# Patient Record
Sex: Male | Born: 1975 | Race: White | Hispanic: No | Marital: Married | Smoking: Former smoker
Health system: Southern US, Community
[De-identification: ages and names within clinical notes are randomized; demographics above are authoritative.]

## PROBLEM LIST (undated history)

## (undated) DIAGNOSIS — R51 Headache: Secondary | ICD-10-CM

## (undated) DIAGNOSIS — I10 Essential (primary) hypertension: Secondary | ICD-10-CM

## (undated) DIAGNOSIS — E785 Hyperlipidemia, unspecified: Secondary | ICD-10-CM

## (undated) DIAGNOSIS — R519 Headache, unspecified: Secondary | ICD-10-CM

## (undated) DIAGNOSIS — G8929 Other chronic pain: Secondary | ICD-10-CM

## (undated) DIAGNOSIS — G43909 Migraine, unspecified, not intractable, without status migrainosus: Secondary | ICD-10-CM

## (undated) HISTORY — PX: BACK SURGERY: SHX140

## (undated) HISTORY — PX: WISDOM TOOTH EXTRACTION: SHX21

## (undated) HISTORY — DX: Headache: R51

## (undated) HISTORY — DX: Hyperlipidemia, unspecified: E78.5

## (undated) HISTORY — DX: Essential (primary) hypertension: I10

## (undated) HISTORY — DX: Other chronic pain: G89.29

## (undated) HISTORY — PX: KNEE ARTHROSCOPY: SHX127

## (undated) HISTORY — DX: Migraine, unspecified, not intractable, without status migrainosus: G43.909

## (undated) HISTORY — DX: Headache, unspecified: R51.9

---

## 1998-08-04 ENCOUNTER — Emergency Department (HOSPITAL_COMMUNITY): Admission: EM | Admit: 1998-08-04 | Discharge: 1998-08-04 | Payer: Self-pay | Admitting: Emergency Medicine

## 2000-01-16 ENCOUNTER — Emergency Department (HOSPITAL_COMMUNITY): Admission: EM | Admit: 2000-01-16 | Discharge: 2000-01-16 | Payer: Self-pay | Admitting: Emergency Medicine

## 2005-06-29 ENCOUNTER — Emergency Department: Payer: Self-pay | Admitting: Emergency Medicine

## 2013-08-27 DIAGNOSIS — M7700 Medial epicondylitis, unspecified elbow: Secondary | ICD-10-CM | POA: Insufficient documentation

## 2015-04-07 DIAGNOSIS — Z8249 Family history of ischemic heart disease and other diseases of the circulatory system: Secondary | ICD-10-CM | POA: Insufficient documentation

## 2015-05-11 DIAGNOSIS — K219 Gastro-esophageal reflux disease without esophagitis: Secondary | ICD-10-CM | POA: Insufficient documentation

## 2015-05-11 DIAGNOSIS — E669 Obesity, unspecified: Secondary | ICD-10-CM | POA: Insufficient documentation

## 2015-05-26 DIAGNOSIS — E559 Vitamin D deficiency, unspecified: Secondary | ICD-10-CM | POA: Insufficient documentation

## 2015-05-26 DIAGNOSIS — R7303 Prediabetes: Secondary | ICD-10-CM | POA: Insufficient documentation

## 2015-08-28 ENCOUNTER — Encounter: Payer: Self-pay | Admitting: Pulmonary Disease

## 2015-08-28 ENCOUNTER — Ambulatory Visit (INDEPENDENT_AMBULATORY_CARE_PROVIDER_SITE_OTHER): Payer: BLUE CROSS/BLUE SHIELD | Admitting: Pulmonary Disease

## 2015-08-28 VITALS — BP 148/106 | HR 84 | Ht 70.0 in | Wt 209.8 lb

## 2015-08-28 DIAGNOSIS — R0683 Snoring: Secondary | ICD-10-CM

## 2015-08-28 DIAGNOSIS — Z720 Tobacco use: Secondary | ICD-10-CM | POA: Diagnosis not present

## 2015-08-28 DIAGNOSIS — Z23 Encounter for immunization: Secondary | ICD-10-CM

## 2015-08-28 NOTE — Progress Notes (Signed)
Past Medical History He  has a past medical history of HTN (hypertension); Hyperlipidemia; and Chronic headaches.  Past Surgical History He  has past surgical history that includes Knee arthroscopy and Wisdom tooth extraction.  No current outpatient prescriptions on file prior to visit.   No current facility-administered medications on file prior to visit.    No Known Allergies  Family History His family history includes Cancer - Lung in his mother; Heart disease in his father and mother.  Social History He  reports that he has been smoking Cigarettes.  He has a 12 pack-year smoking history. He does not have any smokeless tobacco history on file. He reports that he drinks alcohol. He reports that he does not use illicit drugs.  Review of systems Review of Systems  Constitutional: Negative for fever and unexpected weight change.  HENT: Negative for congestion, dental problem, ear pain, nosebleeds, postnasal drip, rhinorrhea, sinus pressure, sneezing, sore throat and trouble swallowing.   Eyes: Negative for redness and itching.  Respiratory: Negative for cough, chest tightness, shortness of breath and wheezing.   Cardiovascular: Negative for palpitations and leg swelling.  Gastrointestinal: Negative for nausea and vomiting.  Genitourinary: Negative for dysuria.  Musculoskeletal: Negative for joint swelling.  Skin: Negative for rash.  Neurological: Positive for headaches.  Hematological: Does not bruise/bleed easily.  Psychiatric/Behavioral: Negative for dysphoric mood. The patient is not nervous/anxious.     Chief Complaint  Patient presents with  . Sleep Consult    Self referral: severe snoring. Epworth Score: 6    Tests:  Vital signs BP 148/106 mmHg  Pulse 84  Ht 5\' 10"  (1.778 m)  Wt 209 lb 12.8 oz (95.165 kg)  BMI 30.10 kg/m2  SpO2 94%  History of Present Illness Yolonda KidaVonnie H Mccown is a 40 y.o. male for evaluation of sleep problems.  His wife has been concerned  about his sleep and snoring.  His sleep has been getting worse.  He has trouble falling asleep, and staying asleep.  His wife has told him that he stops breathing while asleep.  He will wake up with a dry mouth, and has trouble sleeping on his back.  He doesn't dream much.    He goes to sleep at 11 pm.  He falls asleep after a while.  He wakes up several times, but is not sure what wakes him up.  He sometimes watches TV to help him fall back to sleep.  He gets out of bed at 6 am.  He feels tired in the morning.  He gets frequent headaches.  He has been using [place] for years, but this doesn't seem to make a difference.  He tried OTC sleep aides, but no benefit from these either.  He drinks coffee in the morning, and sodas several times during the day.  He takes zoloft for anxiety >> he has lots of stress from work.  He denies sleep walking, sleep talking, bruxism, or nightmares.  There is no history of restless legs.  He denies sleep hallucinations, sleep paralysis, or cataplexy.  The Epworth score is 6 out of 24.   Physical Exam:  General - No distress ENT - No sinus tenderness, no oral exudate, no LAN, no thyromegaly, TM clear, pupils equal/reactive, MP 4, scalloped tongue Cardiac - s1s2 regular, no murmur, pulses symmetric Chest - No wheeze/rales/dullness, good air entry, normal respiratory excursion Back - No focal tenderness Abd - Soft, non-tender, no organomegaly, + bowel sounds Ext - No edema Neuro - Normal strength,  cranial nerves intact Skin - No rashes Psych - Normal mood, and behavior  Discussion: He has snoring, sleep disruption, witnessed apnea, and daytime sleepiness.  He has hx of HTN and mood disorder.  I am concerned he could have sleep apnea.  We discussed how sleep apnea can affect various health problems, including risks for hypertension, cardiovascular disease, and diabetes.  We also discussed how sleep disruption can increase risks for accidents, such as while driving.   Weight loss as a means of improving sleep apnea was also reviewed.  Additional treatment options discussed were CPAP therapy, oral appliance, and surgical intervention.   Assessment/plan:  Snoring. Plan: - will arrange for in lab sleep study to further assess  Hypertension. Plan: - advised him to discuss with his PCP  Tobacco abuse. Plan: - discussed options to assist with smoking cessation    Patient Instructions  Will arrange for sleep study Will call to arrange for follow up after sleep study reviewed      Coralyn Helling, M.D. Pager [phone]

## 2015-08-28 NOTE — Progress Notes (Deleted)
   Subjective:    Patient ID: Yolonda KidaVonnie H Stoecker, male    DOB: 09-15-1975, 40 y.o.   MRN: [id number]  HPI    Review of Systems  Constitutional: Negative for fever and unexpected weight change.  HENT: Negative for congestion, dental problem, ear pain, nosebleeds, postnasal drip, rhinorrhea, sinus pressure, sneezing, sore throat and trouble swallowing.   Eyes: Negative for redness and itching.  Respiratory: Negative for cough, chest tightness, shortness of breath and wheezing.   Cardiovascular: Negative for palpitations and leg swelling.  Gastrointestinal: Negative for nausea and vomiting.  Genitourinary: Negative for dysuria.  Musculoskeletal: Negative for joint swelling.  Skin: Negative for rash.  Neurological: Positive for headaches.  Hematological: Does not bruise/bleed easily.  Psychiatric/Behavioral: Negative for dysphoric mood. The patient is not nervous/anxious.        Objective:   Physical Exam        Assessment & Plan:

## 2015-08-28 NOTE — Patient Instructions (Signed)
Will arrange for sleep study Will call to arrange for follow up after sleep study reviewed 

## 2015-09-09 ENCOUNTER — Ambulatory Visit (HOSPITAL_BASED_OUTPATIENT_CLINIC_OR_DEPARTMENT_OTHER): Payer: BLUE CROSS/BLUE SHIELD | Attending: Pulmonary Disease | Admitting: Radiology

## 2015-09-09 DIAGNOSIS — R0683 Snoring: Secondary | ICD-10-CM | POA: Insufficient documentation

## 2015-09-09 DIAGNOSIS — R5383 Other fatigue: Secondary | ICD-10-CM | POA: Insufficient documentation

## 2015-09-09 DIAGNOSIS — G4730 Sleep apnea, unspecified: Secondary | ICD-10-CM | POA: Insufficient documentation

## 2015-09-09 DIAGNOSIS — G4733 Obstructive sleep apnea (adult) (pediatric): Secondary | ICD-10-CM | POA: Insufficient documentation

## 2015-09-09 DIAGNOSIS — Z79899 Other long term (current) drug therapy: Secondary | ICD-10-CM | POA: Insufficient documentation

## 2015-09-09 DIAGNOSIS — R51 Headache: Secondary | ICD-10-CM | POA: Insufficient documentation

## 2015-09-10 ENCOUNTER — Telehealth: Payer: Self-pay | Admitting: Pulmonary Disease

## 2015-09-10 DIAGNOSIS — G4733 Obstructive sleep apnea (adult) (pediatric): Secondary | ICD-10-CM | POA: Diagnosis not present

## 2015-09-10 NOTE — Telephone Encounter (Signed)
PSG 09/09/15 >> AHI 14.4, SaO2 low 88%.  CPAP 13 cm H2O >> AHI 0.  Will have my nurse inform pt that sleep study shows mild sleep apnea.  Options are 1) CPAP now, 2) ROV first.  If He is agreeable to CPAP, then please send order for auto CPAP range 5 to 15 cm H2O with heated humidity and mask of choice.  Have download sent 1 month after starting CPAP and set up ROV 2 months after starting CPAP >> can be with me or Tammy Parrett.

## 2015-09-10 NOTE — Progress Notes (Signed)
Patient Name: Cory Hoffman, Cory Hoffman Study Date: 09/09/2015 Gender: Male D.O.B: 06/10/1976 Age (years): 39 Referring Provider: Vineet Sood MD, ABSM Height (inches): 70 Interpreting Physician: Vineet Sood MD, ABSM Weight (lbs): 210 RPSGT: Gregory, Kenyon BMI: 30 MRN: [id number] Neck Size: 16.50  CLINICAL INFORMATION Sleep Study Type: Split Night CPAP Indication for sleep study: Fatigue, Morning Headaches, Snoring, Witnessed Apneas Epworth Sleepiness Score: 4  SLEEP STUDY TECHNIQUE As per the AASM Manual for the Scoring of Sleep and Associated Events v2.3 (April 2016) with a hypopnea requiring 4% desaturations. The channels recorded and monitored were frontal, central and occipital EEG, electrooculogram (EOG), submentalis EMG (chin), nasal and oral airflow, thoracic and abdominal wall motion, anterior tibialis EMG, snore microphone, electrocardiogram, and pulse oximetry. Continuous positive airway pressure (CPAP) was initiated when the patient met split night criteria and was titrated according to treat sleep-disordered breathing.  MEDICATIONS Medications taken by the patient : AMLODIPINE, LIPITOR, OMEPRAZOLE, SERTRALINE, AMBIEN  Medications administered by patient during sleep study : Sleep medicine administered - Ambien 10 mg at 10:00:26 PM  RESPIRATORY PARAMETERS Diagnostic Total AHI (/hr): 14.4 RDI (/hr): 31.3 OA Index (/hr): 0.5 CA Index (/hr): 0.0 REM AHI (/hr): N/A NREM AHI (/hr): 14.4 Supine AHI (/hr): 31.8 Non-supine AHI (/hr): 7.62 Min O2 Sat (%): 88.00 Mean O2 (%): 91.96 Time below 88% (min): 0.3   Titration Optimal Pressure (cm): 13 AHI at Optimal Pressure (/hr): 4.4 Min O2 at Optimal Pressure (%): 92.0 Supine % at Optimal (%): 0 Sleep % at Optimal (%): 98    SLEEP ARCHITECTURE The recording time for the entire night was 433.0 minutes. During a baseline period of 180.0 minutes, the patient slept for 120.6 minutes in REM and nonREM, yielding a sleep efficiency of 67.0%. Sleep  onset after lights out was 19.8 minutes with a REM latency of N/A minutes. The patient spent 14.61% of the night in stage N1 sleep, 75.03% in stage N2 sleep, 10.36% in stage N3 and 0.00% in REM. During the titration period of 245.5 minutes, the patient slept for 216.0 minutes in REM and nonREM, yielding a sleep efficiency of 88.0%. Sleep onset after CPAP initiation was 4.4 minutes with a REM latency of 7.5 minutes. The patient spent 16.44% of the night in stage N1 sleep, 48.84% in stage N2 sleep, 0.00% in stage N3 and 34.72% in REM.  CARDIAC DATA The 2 lead EKG demonstrated sinus rhythm. The mean heart rate was 74.23 beats per minute. Other EKG findings include: None.  LEG MOVEMENT DATA The total Periodic Limb Movements of Sleep (PLMS) were 53. The PLMS index was 9.41 .  IMPRESSIONS This study showed mild obstructive sleep apnea with an AHI of 14.4 and SaO2 low of 88%.  He did well with CPAP 13 cm H2O.  He was observed REM sleep at this pressure setting.  DIAGNOSIS - Obstructive Sleep Apnea (327.23 [G47.33 ICD-10])  RECOMMENDATIONS Additional therapies include weight loss, CPAP, oral appliance, or surgical assessment.  He was fitted with a Medium size [employer] Full Face Mask Simplus mask.   Vineet Sood, MD, ABSM Diplomate, American Board of Sleep Medicine 09/10/2015, 5:05 PM  NPI: [id number]  

## 2015-09-11 NOTE — Telephone Encounter (Signed)
lmtcb x1 for pt. 

## 2015-09-18 NOTE — Telephone Encounter (Signed)
lmomtcb x 2  

## 2015-09-23 NOTE — Telephone Encounter (Signed)
lmtcb x3 for pt. 

## 2015-09-24 ENCOUNTER — Ambulatory Visit (INDEPENDENT_AMBULATORY_CARE_PROVIDER_SITE_OTHER): Payer: BLUE CROSS/BLUE SHIELD | Admitting: Adult Health

## 2015-09-24 ENCOUNTER — Encounter: Payer: Self-pay | Admitting: Adult Health

## 2015-09-24 VITALS — BP 136/82 | HR 73 | Temp 98.0°F | Ht 70.0 in | Wt 208.0 lb

## 2015-09-24 DIAGNOSIS — G4733 Obstructive sleep apnea (adult) (pediatric): Secondary | ICD-10-CM | POA: Insufficient documentation

## 2015-09-24 NOTE — Patient Instructions (Signed)
Begin CPAP At bedtime   Goal is to wear for at least 4-6hr each night  Work on weight loss  Do not drive if sleepy CPAP download in 1 month .  Follow up Dr. Craige Cotta  Or Kirklin Mcduffee in 2 months

## 2015-09-24 NOTE — Progress Notes (Signed)
Subjective:    Patient ID: Cory Hoffman, male    DOB: 1976/06/24, 40 y.o.   MRN: [id number]  HPI 40 yo male seen for sleep consult 08/2015 .   TEST  PSG 09/09/15 >> AHI 14.4, SaO2 low 88%. CPAP 13 cm H2O >> AHI 0.  09/24/2015 Follow up : OSA  Pt returns for follow up for sleep apnea.  Pt was seen last month for sleep consult. For daytime sleepiness and snoring.  Sleep study showed mild  sleep apnea. , AHI 14.4 . See results above.  We reviewed his sleep study results.  Went over treatment options including, weight loss, CPAP and oral appliance.  He would like to proceed with CPAP .  Went over sleep apnea, CPAP patient education  Denies chest pain, orthopnea, edema or fever.    Past Medical History  Diagnosis Date  . HTN (hypertension)   . Hyperlipidemia   . Chronic headaches    Current Outpatient Prescriptions on File Prior to Visit  Medication Sig Dispense Refill  . Atorvastatin Calcium (LIPITOR PO) Take 40 mg by mouth daily.     Marland Kitchen OMEPRAZOLE PO Take 20 mg by mouth daily.     . SERTRALINE HCL PO Take 50 mg by mouth daily.     . SUMAtriptan (IMITREX) 50 MG tablet Take 50 mg by mouth every 2 (two) hours as needed for migraine. May repeat in 2 hours if headache persists or recurs.    Marland Kitchen zolpidem (AMBIEN) 10 MG tablet Take 10 mg by mouth at bedtime as needed for sleep.     No current facility-administered medications on file prior to visit.      Review of Systems Constitutional:   No  weight loss, night sweats,  Fevers, chills, + fatigue, or  lassitude.  HEENT:   No headaches,  Difficulty swallowing,  Tooth/dental problems, or  Sore throat,                No sneezing, itching, ear ache, nasal congestion, post nasal drip,   CV:  No chest pain,  Orthopnea, PND, swelling in lower extremities, anasarca, dizziness, palpitations, syncope.   GI  No heartburn, indigestion, abdominal pain, nausea, vomiting, diarrhea, change in bowel habits, loss of appetite, bloody stools.    Resp: No shortness of breath with exertion or at rest.  No excess mucus, no productive cough,  No non-productive cough,  No coughing up of blood.  No change in color of mucus.  No wheezing.  No chest wall deformity  Skin: no rash or lesions.  GU: no dysuria, change in color of urine, no urgency or frequency.  No flank pain, no hematuria   MS:  No joint pain or swelling.  No decreased range of motion.  No back pain.  Psych:  No change in mood or affect. No depression or anxiety.  No memory loss.         Objective:   Physical Exam  Filed Vitals:   09/24/15 1032  BP: 136/82  Pulse: 73  Temp: 98 F (36.7 C)  TempSrc: Oral  Height:  (1.778 m)  Weight: 208 lb (94.348 kg)  SpO2: 99%    Body mass index is 29.84 kg/(m^2).   GEN: A/Ox3; pleasant , NAD, well nourished   HEENT:  [place]/AT,  EACs-clear, TMs-wnl, NOSE-clear, THROAT-clear, no lesions, no postnasal drip or exudate noted. Class 2-3 MP airway   NECK:  Supple w/ fair ROM; no JVD; normal carotid impulses w/o bruits; no  thyromegaly or nodules palpated; no lymphadenopathy.  RESP  Clear  P & A; w/o, wheezes/ rales/ or rhonchi.no accessory muscle use, no dullness to percussion  CARD:  RRR, no m/r/g  , no peripheral edema, pulses intact, no cyanosis or clubbing.  GI:   Soft & nt; nml bowel sounds; no organomegaly or masses detected.  Musco: Warm bil, no deformities or joint swelling noted.   Neuro: alert, no focal deficits noted.    Skin: Warm, no lesions or rashes        Assessment & Plan:

## 2015-09-24 NOTE — Assessment & Plan Note (Signed)
Mild OSA on sleep study Pt education given , pt wishes to proceed with nocturnal CPAP   Plan  Begin CPAP At bedtime   Goal is to wear for at least 4-6hr each night  Work on weight loss  Do not drive if sleepy CPAP download in 1 month .  Follow up Dr. Craige Cotta  Or Parrett in 2 months

## 2015-09-24 NOTE — Progress Notes (Signed)
Reviewed and agree with assessment/plan. 

## 2015-09-26 NOTE — Telephone Encounter (Signed)
lmtcb

## 2015-09-29 NOTE — Telephone Encounter (Signed)
LM for patient Letter mailed to home address.  Will send to Dr Craige Cotta as Lorain Childes of several attempts to reach patient.

## 2015-09-29 NOTE — Telephone Encounter (Signed)
Noted  

## 2015-10-19 ENCOUNTER — Encounter (HOSPITAL_BASED_OUTPATIENT_CLINIC_OR_DEPARTMENT_OTHER): Payer: BLUE CROSS/BLUE SHIELD

## 2015-11-04 DIAGNOSIS — G4733 Obstructive sleep apnea (adult) (pediatric): Secondary | ICD-10-CM | POA: Diagnosis not present

## 2015-11-24 ENCOUNTER — Ambulatory Visit: Payer: BLUE CROSS/BLUE SHIELD | Admitting: Adult Health

## 2015-12-05 DIAGNOSIS — G4733 Obstructive sleep apnea (adult) (pediatric): Secondary | ICD-10-CM | POA: Diagnosis not present

## 2016-01-04 DIAGNOSIS — G4733 Obstructive sleep apnea (adult) (pediatric): Secondary | ICD-10-CM | POA: Diagnosis not present

## 2016-02-04 DIAGNOSIS — G4733 Obstructive sleep apnea (adult) (pediatric): Secondary | ICD-10-CM | POA: Diagnosis not present

## 2016-02-05 DIAGNOSIS — G43909 Migraine, unspecified, not intractable, without status migrainosus: Secondary | ICD-10-CM | POA: Diagnosis not present

## 2016-02-05 DIAGNOSIS — R5383 Other fatigue: Secondary | ICD-10-CM | POA: Diagnosis not present

## 2016-02-05 DIAGNOSIS — F419 Anxiety disorder, unspecified: Secondary | ICD-10-CM | POA: Diagnosis not present

## 2016-02-05 DIAGNOSIS — G4733 Obstructive sleep apnea (adult) (pediatric): Secondary | ICD-10-CM | POA: Diagnosis not present

## 2016-02-17 ENCOUNTER — Ambulatory Visit: Payer: BLUE CROSS/BLUE SHIELD | Admitting: Family

## 2016-02-17 DIAGNOSIS — Z0289 Encounter for other administrative examinations: Secondary | ICD-10-CM

## 2016-03-04 DIAGNOSIS — M25562 Pain in left knee: Secondary | ICD-10-CM | POA: Diagnosis not present

## 2016-03-05 DIAGNOSIS — G4733 Obstructive sleep apnea (adult) (pediatric): Secondary | ICD-10-CM | POA: Diagnosis not present

## 2016-03-08 ENCOUNTER — Ambulatory Visit: Payer: BLUE CROSS/BLUE SHIELD | Admitting: Family

## 2016-04-05 DIAGNOSIS — G4733 Obstructive sleep apnea (adult) (pediatric): Secondary | ICD-10-CM | POA: Diagnosis not present

## 2016-04-07 DIAGNOSIS — F419 Anxiety disorder, unspecified: Secondary | ICD-10-CM | POA: Diagnosis not present

## 2016-04-07 DIAGNOSIS — Z716 Tobacco abuse counseling: Secondary | ICD-10-CM | POA: Diagnosis not present

## 2016-04-07 DIAGNOSIS — Z720 Tobacco use: Secondary | ICD-10-CM | POA: Diagnosis not present

## 2016-04-07 DIAGNOSIS — Z0000 Encounter for general adult medical examination without abnormal findings: Secondary | ICD-10-CM | POA: Insufficient documentation

## 2016-04-07 DIAGNOSIS — G43909 Migraine, unspecified, not intractable, without status migrainosus: Secondary | ICD-10-CM | POA: Diagnosis not present

## 2016-05-06 DIAGNOSIS — G4733 Obstructive sleep apnea (adult) (pediatric): Secondary | ICD-10-CM | POA: Diagnosis not present

## 2016-05-11 DIAGNOSIS — G43909 Migraine, unspecified, not intractable, without status migrainosus: Secondary | ICD-10-CM | POA: Diagnosis not present

## 2016-05-11 DIAGNOSIS — K219 Gastro-esophageal reflux disease without esophagitis: Secondary | ICD-10-CM | POA: Diagnosis not present

## 2016-05-11 DIAGNOSIS — F419 Anxiety disorder, unspecified: Secondary | ICD-10-CM | POA: Diagnosis not present

## 2016-05-11 DIAGNOSIS — I10 Essential (primary) hypertension: Secondary | ICD-10-CM | POA: Diagnosis not present

## 2016-06-05 DIAGNOSIS — G4733 Obstructive sleep apnea (adult) (pediatric): Secondary | ICD-10-CM | POA: Diagnosis not present

## 2016-07-06 DIAGNOSIS — G4733 Obstructive sleep apnea (adult) (pediatric): Secondary | ICD-10-CM | POA: Diagnosis not present

## 2016-08-05 DIAGNOSIS — G4733 Obstructive sleep apnea (adult) (pediatric): Secondary | ICD-10-CM | POA: Diagnosis not present

## 2016-08-23 DIAGNOSIS — G4733 Obstructive sleep apnea (adult) (pediatric): Secondary | ICD-10-CM | POA: Diagnosis not present

## 2016-10-06 DIAGNOSIS — G43909 Migraine, unspecified, not intractable, without status migrainosus: Secondary | ICD-10-CM | POA: Diagnosis not present

## 2016-10-06 DIAGNOSIS — E785 Hyperlipidemia, unspecified: Secondary | ICD-10-CM | POA: Diagnosis not present

## 2016-10-06 DIAGNOSIS — K219 Gastro-esophageal reflux disease without esophagitis: Secondary | ICD-10-CM | POA: Diagnosis not present

## 2016-10-06 DIAGNOSIS — I10 Essential (primary) hypertension: Secondary | ICD-10-CM | POA: Diagnosis not present

## 2017-01-31 DIAGNOSIS — I10 Essential (primary) hypertension: Secondary | ICD-10-CM | POA: Diagnosis not present

## 2017-01-31 DIAGNOSIS — E785 Hyperlipidemia, unspecified: Secondary | ICD-10-CM | POA: Diagnosis not present

## 2017-01-31 DIAGNOSIS — G4700 Insomnia, unspecified: Secondary | ICD-10-CM | POA: Diagnosis not present

## 2017-01-31 DIAGNOSIS — K219 Gastro-esophageal reflux disease without esophagitis: Secondary | ICD-10-CM | POA: Diagnosis not present

## 2017-03-01 ENCOUNTER — Ambulatory Visit: Payer: BLUE CROSS/BLUE SHIELD | Admitting: Family

## 2017-03-14 ENCOUNTER — Other Ambulatory Visit (INDEPENDENT_AMBULATORY_CARE_PROVIDER_SITE_OTHER): Payer: BLUE CROSS/BLUE SHIELD

## 2017-03-14 ENCOUNTER — Ambulatory Visit (INDEPENDENT_AMBULATORY_CARE_PROVIDER_SITE_OTHER): Payer: BLUE CROSS/BLUE SHIELD | Admitting: Family

## 2017-03-14 ENCOUNTER — Other Ambulatory Visit: Payer: Self-pay | Admitting: Family

## 2017-03-14 ENCOUNTER — Telehealth: Payer: Self-pay | Admitting: Family

## 2017-03-14 ENCOUNTER — Encounter: Payer: Self-pay | Admitting: Family

## 2017-03-14 VITALS — BP 118/84 | HR 74 | Temp 98.2°F | Resp 16 | Ht 70.0 in | Wt 209.0 lb

## 2017-03-14 DIAGNOSIS — I10 Essential (primary) hypertension: Secondary | ICD-10-CM | POA: Diagnosis not present

## 2017-03-14 DIAGNOSIS — M25511 Pain in right shoulder: Secondary | ICD-10-CM

## 2017-03-14 DIAGNOSIS — K2100 Gastro-esophageal reflux disease with esophagitis, without bleeding: Secondary | ICD-10-CM | POA: Insufficient documentation

## 2017-03-14 LAB — BASIC METABOLIC PANEL
BUN: 9 mg/dL (ref 6–23)
CALCIUM: 9.2 mg/dL (ref 8.4–10.5)
CO2: 28 mEq/L (ref 19–32)
CREATININE: 0.98 mg/dL (ref 0.40–1.50)
Chloride: 104 mEq/L (ref 96–112)
GFR: 90.66 mL/min (ref >60.00)
GLUCOSE: 102 mg/dL — AB (ref 70–99)
Potassium: 3.8 mEq/L (ref 3.5–5.1)
Sodium: 139 mEq/L (ref 135–145)

## 2017-03-14 MED ORDER — ZOLPIDEM TARTRATE 10 MG PO TABS: 10.0000 mg | ORAL_TABLET | Freq: Every evening | ORAL | 1 refills | Status: DC | PRN

## 2017-03-14 MED ORDER — ENALAPRIL MALEATE 5 MG PO TABS: 5.0000 mg | ORAL_TABLET | Freq: Every day | ORAL | 1 refills | Status: DC

## 2017-03-14 MED ORDER — ATORVASTATIN CALCIUM 40 MG PO TABS: 40.0000 mg | ORAL_TABLET | Freq: Every day | ORAL | 0 refills | Status: DC

## 2017-03-14 MED ORDER — SERTRALINE HCL 100 MG PO TABS: 100.0000 mg | ORAL_TABLET | Freq: Every day | ORAL | 3 refills | Status: DC

## 2017-03-14 NOTE — Telephone Encounter (Signed)
Received order - needs to be changed to MRI of right shoulder with and without contrast in order to get scheduled.  Please correct and resend.

## 2017-03-14 NOTE — Assessment & Plan Note (Signed)
Acute pain in the right shoulder with concern for possible rotator cuff tendinopathy and labral tear. Obtain MRI. Treat conservatively with ice, home exercise therapy, and over-the-counter medications as needed for symptom relief and supportive care. Refer to orthopedics for further assessment and treatment. Follow-up pending MRI results and referral.

## 2017-03-14 NOTE — Telephone Encounter (Signed)
Done

## 2017-03-14 NOTE — Progress Notes (Signed)
Subjective:    Patient ID: Cory Hoffman, male    DOB: 16-Oct-1975, 41 y.o.   MRN: [id number]  Chief Complaint  Patient presents with  . Establish Care    hurt his right shoulder over a week ago, was working on race car and felt a pop in his shoulder     HPI:  Cory Hoffman is a 41 y.o. male who  has a past medical history of Chronic headaches; Frequent headaches; HTN (hypertension); Hyperlipidemia; and Migraines. and presents today for an office visit to establish care.    1.)  Shoulder - This is a new problem. Associated symptom of pain located in his right shoulder has been going on for about 1 week when he was working on a race car when he felt a pop located on the top. Describes a catching and popping sensation. Pain is sharp at times and is generally constant. No neck pain or numbness or tingling. Modifying factors include Tylenol which did not help very much. Function remains in tact, however now with pain. No previous history of shoulder injury. He is left handed.    2.) Insomnia - Currently maintained on Ambien. Reports taken medications prescribed and denies adverse side effects. Sleeping adequately with current medication regimen.  3.) Gastroesophageal reflux - currently maintained on omeprazole. Reports taking medication as prescribed and denies adverse side effects. Symptoms are generally well controlled with current medication regimen. Working on a GERD controlled diet.  4.) Hypertension - currently maintained on enalapril. Reports taking the medication as prescribed and denies adverse side effects or hypotensive readings. Denies worse headache of life no symptoms of end organ damage. Does not currently check blood pressure at home. Working on following a low-sodium diet.  BP Readings from Last 3 Encounters:  03/14/17 118/84  09/24/15 136/82  08/28/15 (!) 148/106     No Known Allergies    Outpatient Medications Prior to Visit  Medication Sig Dispense Refill  .  HYDROcodone-acetaminophen (NORCO/VICODIN) 5-325 MG tablet Take 1 tablet by mouth daily as needed.  0  . OMEPRAZOLE PO Take 20 mg by mouth daily.     . SUMAtriptan (IMITREX) 50 MG tablet Take 50 mg by mouth every 2 (two) hours as needed for migraine. May repeat in 2 hours if headache persists or recurs.    . Atorvastatin Calcium (LIPITOR PO) Take 40 mg by mouth daily.     . enalapril (VASOTEC) 5 MG tablet Take 1 tablet by mouth daily.  2  . SERTRALINE HCL PO Take 50 mg by mouth daily.     Marland Kitchen zolpidem (AMBIEN) 10 MG tablet Take 10 mg by mouth at bedtime as needed for sleep.     No facility-administered medications prior to visit.      Past Medical History:  Diagnosis Date  . Chronic headaches   . Frequent headaches   . HTN (hypertension)   . Hyperlipidemia   . Migraines       Past Surgical History:  Procedure Laterality Date  . BACK SURGERY    . KNEE ARTHROSCOPY    . WISDOM TOOTH EXTRACTION        Family History  Problem Relation Age of Onset  . Heart disease Mother   . Cancer - Lung Mother   . Heart disease Father   . Stroke Paternal Grandmother   . Heart attack Paternal Grandfather        Mid 36s      Social History   Social History  .  Marital status: Married    Spouse name: N/A  . Number of children: 4  . Years of education: 5616   Occupational History  . Data processing managerBranch manager    Social History Main Topics  . Smoking status: Current Every Day Smoker    Packs/day: 1.00    Years: 15.00    Types: Cigarettes  . Smokeless tobacco: Never Used  . Alcohol use 0.0 oz/week     Comment: social/weekly  . Drug use: No  . Sexual activity: Yes   Other Topics Concern  . Not on file   Social History Narrative   Fun/Hobby: Racing, Photographerdrag racing.       Review of Systems  Constitutional: Negative for chills and fever.  Eyes:       Negative for changes in vision  Respiratory: Negative for cough, chest tightness, wheezing and stridor.   Cardiovascular: Negative for  chest pain, palpitations and leg swelling.  Musculoskeletal:       Positive for right shoulder pain  Neurological: Negative for dizziness, weakness, light-headedness and numbness.  Psychiatric/Behavioral: Positive for sleep disturbance.       Objective:    BP 118/84 (BP Location: Left Arm, Patient Position: Sitting, Cuff Size: Large)   Pulse 74   Temp 98.2 F (36.8 C) (Oral)   Resp 16   Ht 5\' 10"  (1.778 m)   Wt 209 lb (94.8 kg)   SpO2 95%   BMI 29.99 kg/m  Nursing note and vital signs reviewed.  Physical Exam  Constitutional: He is oriented to person, place, and time. He appears well-developed and well-nourished. No distress.  Cardiovascular: Normal rate, regular rhythm, normal heart sounds and intact distal pulses.   Pulmonary/Chest: Effort normal and breath sounds normal.  Musculoskeletal:  Right shoulder - no obvious deformity, discoloration, or edema. Palpable tenderness over subacromial space. Range of motion slightly restricted greater than 120 of flexion and abduction with pain. Strength is decreased on the right compared to the left. Distal pulses and sensation are intact and appropriate. No neck pain. Clinic/pop noted with passive range of motion into flexion. Negative Neer's impingement; some weakness with empty can.  Neurological: He is alert and oriented to person, place, and time.  Skin: Skin is warm and dry.  Psychiatric: He has a normal mood and affect. His behavior is normal. Judgment and thought content normal.        Assessment & Plan:   Problem List Items Addressed This Visit      Cardiovascular and Mediastinum   Essential hypertension    Blood pressure well controlled with current medication regimen and no adverse side effects or hypotensive readings. Continue current dosage of enalapril. Encouraged to monitor blood pressure at home and follow low-sodium diet.      Relevant Medications   enalapril (VASOTEC) 5 MG tablet   atorvastatin (LIPITOR) 40 MG  tablet     Digestive   Gastroesophageal reflux disease with esophagitis    Symptoms of gastroesophageal reflux adequately maintained with current dosage of omeprazole and no adverse side effects. Discussed importance of a GERD-related diet. Continue current dosage of omeprazole.        Other   Acute pain of right shoulder - Primary    Acute pain in the right shoulder with concern for possible rotator cuff tendinopathy and labral tear. Obtain MRI. Treat conservatively with ice, home exercise therapy, and over-the-counter medications as needed for symptom relief and supportive care. Refer to orthopedics for further assessment and treatment. Follow-up pending MRI results  and referral.      Relevant Orders   Basic Metabolic Panel (BMET) (Completed)   AMB referral to orthopedics   MR SHOULDER RIGHT W WO CONTRAST       I have discontinued Mr. Puebla's SERTRALINE HCL PO. I have changed his Atorvastatin Calcium (LIPITOR PO) to atorvastatin (LIPITOR) 40 MG tablet. I have also changed his zolpidem and enalapril. Additionally, I am having him start on sertraline. Lastly, I am having him maintain his OMEPRAZOLE PO, SUMAtriptan, and HYDROcodone-acetaminophen.   Meds ordered this encounter  Medications  . sertraline (ZOLOFT) 100 MG tablet    Sig: Take 1 tablet (100 mg total) by mouth daily.    Dispense:  30 tablet    Refill:  3    Order Specific Question:   Supervising Provider    Answer:   Hillard Danker A [[id number]]  . zolpidem (AMBIEN) 10 MG tablet    Sig: Take 1 tablet (10 mg total) by mouth at bedtime as needed for sleep.    Dispense:  30 tablet    Refill:  1    Fill on or after 03/31/17    Order Specific Question:   Supervising Provider    Answer:   Hillard Danker A [[id number]]  . enalapril (VASOTEC) 5 MG tablet    Sig: Take 1 tablet (5 mg total) by mouth daily.    Dispense:  90 tablet    Refill:  1    Order Specific Question:   Supervising Provider    Answer:   Hillard Danker  A [[id number]]  . atorvastatin (LIPITOR) 40 MG tablet    Sig: Take 1 tablet (40 mg total) by mouth daily.    Dispense:  90 tablet    Refill:  0    Order Specific Question:   Supervising Provider    Answer:   Hillard Danker A [[id number]]     Follow-up: Return in about 1 month (around 04/14/2017), or if symptoms worsen or fail to improve.  Jeanine Luz, FNP

## 2017-03-14 NOTE — Patient Instructions (Signed)
Thank you for choosing [facility].  SUMMARY AND INSTRUCTIONS:  Please continue to take your medication as prescribed.  They will call to schedule an MRI for your shoulder.  Ice x 20 minutes every 2 hours and following activity.   Stretches and exercises daily.  They will call with your referral to orthopedics.   Medication:  Your prescription(s) have been submitted to your pharmacy or been printed and provided for you. Please take as directed and contact our office if you believe you are having problem(s) with the medication(s) or have any questions.  Labs:  Please stop by the lab on the lower level of the building for your blood work. Your results will be released to MyChart (or called to you) after review, usually within 72 hours after test completion. If any changes need to be made, you will be notified at that same time.  1.) The lab is open from 7:30am to 5:30 pm Monday-Friday 2.) No appointment is necessary 3.) Fasting (if needed) is 6-8 hours after food and drink; black coffee and water are okay   Follow up:  If your symptoms worsen or fail to improve, please contact our office for further instruction, or in case of emergency go directly to the emergency room at the closest medical facility.    [facility] Ask your health care provider which exercises are safe for you. Do exercises exactly as told by your health care provider and adjust them as directed. It is normal to feel mild stretching, pulling, tightness, or discomfort as you do these exercises, but you should stop right away if you feel sudden pain or your pain gets worse.Do not begin these exercises until told by your health care provider. Stretching and range of motion exercise This exercise warms up your muscles and joints and improves the movement and flexibility of your shoulder. This exercise also helps to relieve pain and stiffness. Exercise A: Passive shoulder horizontal adduction  1. Sit or  stand and pull your left / right elbow across your chest, toward your other shoulder. Stop when you feel a gentle stretch in the back of your shoulder and upper arm. ? Keep your arm at shoulder height. ? Keep your arm as close to your body as you comfortably can. 2. Hold for __________ seconds. 3. Slowly return to the starting position. Repeat __________ times. Complete this exercise __________ times a day. Strengthening exercises These exercises build strength and endurance in your shoulder. Endurance is the ability to use your muscles for a long time, even after they get tired. Exercise B: Scapular protraction, supine  1. Lie on your back on a firm surface. If directed, hold a __________ weight in your left / right hand. 2. Raise your left / right arm straight into the air so your hand is directly above your shoulder joint. 3. Lift your shoulder off of the floor so you push the weight into the air. Do not move your head, neck, or back. 4. Hold for __________ seconds. 5. Slowly return to the starting position. Let your muscles relax completely before you repeat this exercise. Repeat __________ times. Complete this exercise __________ times a day. Exercise C: Scapular retraction  1. Sit in a stable chair without armrests, or stand. 2. Secure an exercise band to a stable object in front of you so the band is at shoulder height. 3. Hold one end of the exercise band in each hand. 4. Squeeze your shoulder blades together and move your elbows slightly  behind you. Do not shrug your shoulders. 5. Hold for __________ seconds. 6. Slowly return to the starting position. Repeat __________ times. Complete this exercise __________ times a day. Exercise D: Shoulder external rotation 1. Lie down on your left / right side. 2. Bend your left / right elbow to an "L" shape (90 degrees). Place a small pillow or a rolled-up towel under your left / right upper arm. 3. With your elbow bent to 90 degrees, place  your left / right hand on your abdomen. 4. Squeeze your shoulder blade back toward your spine. 5. Keeping your upper arm against the pillow or towel, move (pivot) your forearm and hand away from your abdomen and toward the ceiling. Keep your elbow bent to 90 degrees. 6. Hold for __________ seconds. 7. Slowly return to the starting position. Repeat __________ times. Complete this exercise __________ times a day. Exercise E: Shoulder extension, prone  1. Lie on your abdomen on a firm surface so your left / right arm hangs over the edge. 2. Hold a __________ weight in your hand so your palm faces in toward your body. Your arm should be straight. 3. Squeeze your shoulder blade down toward the middle of your back. 4. Slowly raise your arm behind you, up to the height of the surface that you are lying on. Keep your arm straight. 5. Hold for __________ seconds. 6. Slowly return to the starting position and relax your muscles. Repeat __________ times. Complete this exercise __________ times a day. This information is not intended to replace advice given to you by your health care provider. Make sure you discuss any questions you have with your health care provider. Document Released: 08/09/2005 Document Revised: 04/15/2016 Document Reviewed: 07/12/2015 Elsevier Interactive Patient Education  [employer]2018 [employer]vier Inc.

## 2017-03-14 NOTE — Assessment & Plan Note (Signed)
Symptoms of gastroesophageal reflux adequately maintained with current dosage of omeprazole and no adverse side effects. Discussed importance of a GERD-related diet. Continue current dosage of omeprazole.

## 2017-03-14 NOTE — Assessment & Plan Note (Signed)
Blood pressure well controlled with current medication regimen and no adverse side effects or hypotensive readings. Continue current dosage of enalapril. Encouraged to monitor blood pressure at home and follow low-sodium diet.

## 2017-03-15 ENCOUNTER — Other Ambulatory Visit: Payer: Self-pay

## 2017-03-15 ENCOUNTER — Other Ambulatory Visit: Payer: Self-pay | Admitting: Family

## 2017-03-15 ENCOUNTER — Telehealth: Payer: Self-pay | Admitting: Family

## 2017-03-15 DIAGNOSIS — M25511 Pain in right shoulder: Secondary | ICD-10-CM

## 2017-03-15 MED ORDER — TRAMADOL HCL 50 MG PO TABS: 50.0000 mg | ORAL_TABLET | Freq: Three times a day (TID) | ORAL | 0 refills | Status: DC | PRN

## 2017-03-15 NOTE — Telephone Encounter (Signed)
Patient is requesting a pain rx for his shoulder. He states he is in a lot of pain. He has a MRI on Aug 3. You just saw this patient for the first time on 7/23.

## 2017-03-15 NOTE — Telephone Encounter (Signed)
Tramadol printed and to be faxed.

## 2017-03-15 NOTE — Progress Notes (Unsigned)
IMG 

## 2017-03-25 ENCOUNTER — Ambulatory Visit
Admission: RE | Admit: 2017-03-25 | Discharge: 2017-03-25 | Disposition: A | Discharge status: Home / Self Care | Payer: BLUE CROSS/BLUE SHIELD | Source: Ambulatory Visit | Attending: Family | Admitting: Family

## 2017-03-25 DIAGNOSIS — S46011A Strain of muscle(s) and tendon(s) of the rotator cuff of right shoulder, initial encounter: Secondary | ICD-10-CM | POA: Diagnosis not present

## 2017-03-25 DIAGNOSIS — M25511 Pain in right shoulder: Secondary | ICD-10-CM

## 2017-03-25 MED ORDER — IOPAMIDOL (ISOVUE-M 200) INJECTION 41%
15.0000 mL | Freq: Once | INTRAMUSCULAR | Status: AC
  Administered 2017-03-25: 15 mL via INTRA_ARTICULAR

## 2017-03-29 ENCOUNTER — Telehealth: Payer: Self-pay

## 2017-03-29 NOTE — Telephone Encounter (Signed)
Let pt know MRI results of shoulder. He stated that the tramadol is not working for him and is requesting something stronger. Not able to sleep good due to the pain. Please advise.

## 2017-03-30 NOTE — Telephone Encounter (Signed)
A short prescription for vicodin is available for him to pick up for breakthrough pain as needed. W

## 2017-03-31 ENCOUNTER — Other Ambulatory Visit: Payer: Self-pay | Admitting: Family

## 2017-03-31 MED ORDER — HYDROCODONE-ACETAMINOPHEN 5-325 MG PO TABS
1.0000 | ORAL_TABLET | Freq: Three times a day (TID) | ORAL | 0 refills | Status: DC | PRN
Start: 2017-03-31 — End: 2017-08-26

## 2017-03-31 NOTE — Telephone Encounter (Signed)
Pt aware that rx is ready for pick up at the front desk.

## 2017-04-21 DIAGNOSIS — M24111 Other articular cartilage disorders, right shoulder: Secondary | ICD-10-CM | POA: Diagnosis not present

## 2017-04-27 DIAGNOSIS — M75121 Complete rotator cuff tear or rupture of right shoulder, not specified as traumatic: Secondary | ICD-10-CM | POA: Diagnosis not present

## 2017-04-27 DIAGNOSIS — M7541 Impingement syndrome of right shoulder: Secondary | ICD-10-CM | POA: Diagnosis not present

## 2017-04-27 DIAGNOSIS — G8918 Other acute postprocedural pain: Secondary | ICD-10-CM | POA: Diagnosis not present

## 2017-04-27 DIAGNOSIS — M24111 Other articular cartilage disorders, right shoulder: Secondary | ICD-10-CM | POA: Diagnosis not present

## 2017-04-27 DIAGNOSIS — M7581 Other shoulder lesions, right shoulder: Secondary | ICD-10-CM | POA: Diagnosis not present

## 2017-04-28 ENCOUNTER — Telehealth: Payer: Self-pay | Admitting: Family

## 2017-04-28 ENCOUNTER — Telehealth: Payer: Self-pay

## 2017-04-28 MED ORDER — ZOLPIDEM TARTRATE 10 MG PO TABS
10.0000 mg | ORAL_TABLET | Freq: Every evening | ORAL | 1 refills | Status: DC | PRN
Start: 2017-04-28 — End: 2017-06-27

## 2017-04-28 NOTE — Telephone Encounter (Signed)
Ambien refilled and to be faxed.

## 2017-04-28 NOTE — Telephone Encounter (Signed)
Check [name] registry last filled 03/28/2017.../lmb  

## 2017-04-28 NOTE — Telephone Encounter (Signed)
Pt's wife called stating that the pt is needing a refill on zolpidem (AMBIEN) [facility] MG [facility] [address].

## 2017-04-28 NOTE — Telephone Encounter (Signed)
[facility].../lmb 

## 2017-04-28 NOTE — Telephone Encounter (Signed)
Error

## 2017-05-03 DIAGNOSIS — M24111 Other articular cartilage disorders, right shoulder: Secondary | ICD-10-CM | POA: Diagnosis not present

## 2017-05-05 DIAGNOSIS — M75111 Incomplete rotator cuff tear or rupture of right shoulder, not specified as traumatic: Secondary | ICD-10-CM | POA: Diagnosis not present

## 2017-05-05 DIAGNOSIS — M25511 Pain in right shoulder: Secondary | ICD-10-CM | POA: Diagnosis not present

## 2017-05-05 DIAGNOSIS — M25611 Stiffness of right shoulder, not elsewhere classified: Secondary | ICD-10-CM | POA: Diagnosis not present

## 2017-05-05 DIAGNOSIS — M6281 Muscle weakness (generalized): Secondary | ICD-10-CM | POA: Diagnosis not present

## 2017-05-10 DIAGNOSIS — M25611 Stiffness of right shoulder, not elsewhere classified: Secondary | ICD-10-CM | POA: Diagnosis not present

## 2017-05-10 DIAGNOSIS — M6281 Muscle weakness (generalized): Secondary | ICD-10-CM | POA: Diagnosis not present

## 2017-05-10 DIAGNOSIS — M75111 Incomplete rotator cuff tear or rupture of right shoulder, not specified as traumatic: Secondary | ICD-10-CM | POA: Diagnosis not present

## 2017-05-10 DIAGNOSIS — M25511 Pain in right shoulder: Secondary | ICD-10-CM | POA: Diagnosis not present

## 2017-05-16 DIAGNOSIS — M25511 Pain in right shoulder: Secondary | ICD-10-CM | POA: Diagnosis not present

## 2017-05-16 DIAGNOSIS — M6281 Muscle weakness (generalized): Secondary | ICD-10-CM | POA: Diagnosis not present

## 2017-05-16 DIAGNOSIS — M75111 Incomplete rotator cuff tear or rupture of right shoulder, not specified as traumatic: Secondary | ICD-10-CM | POA: Diagnosis not present

## 2017-05-16 DIAGNOSIS — M25611 Stiffness of right shoulder, not elsewhere classified: Secondary | ICD-10-CM | POA: Diagnosis not present

## 2017-05-24 DIAGNOSIS — M75111 Incomplete rotator cuff tear or rupture of right shoulder, not specified as traumatic: Secondary | ICD-10-CM | POA: Diagnosis not present

## 2017-05-24 DIAGNOSIS — M25611 Stiffness of right shoulder, not elsewhere classified: Secondary | ICD-10-CM | POA: Diagnosis not present

## 2017-05-24 DIAGNOSIS — M25511 Pain in right shoulder: Secondary | ICD-10-CM | POA: Diagnosis not present

## 2017-05-24 DIAGNOSIS — M6281 Muscle weakness (generalized): Secondary | ICD-10-CM | POA: Diagnosis not present

## 2017-05-26 DIAGNOSIS — M75111 Incomplete rotator cuff tear or rupture of right shoulder, not specified as traumatic: Secondary | ICD-10-CM | POA: Diagnosis not present

## 2017-05-26 DIAGNOSIS — M25611 Stiffness of right shoulder, not elsewhere classified: Secondary | ICD-10-CM | POA: Diagnosis not present

## 2017-05-26 DIAGNOSIS — M6281 Muscle weakness (generalized): Secondary | ICD-10-CM | POA: Diagnosis not present

## 2017-05-26 DIAGNOSIS — M25511 Pain in right shoulder: Secondary | ICD-10-CM | POA: Diagnosis not present

## 2017-05-31 DIAGNOSIS — M25511 Pain in right shoulder: Secondary | ICD-10-CM | POA: Diagnosis not present

## 2017-06-06 DIAGNOSIS — M25511 Pain in right shoulder: Secondary | ICD-10-CM | POA: Diagnosis not present

## 2017-06-24 ENCOUNTER — Telehealth: Payer: Self-pay | Admitting: Family

## 2017-06-24 NOTE — Telephone Encounter (Signed)
Pt wife requesting a refill of his zolpidem (AMBIEN) 10 MG tablet  Please advise  [facility]

## 2017-06-24 NOTE — Telephone Encounter (Signed)
Check [place] registry last filled 05/26/2017 pls advise...Raechel Chute/lmb

## 2017-06-27 MED ORDER — ZOLPIDEM TARTRATE 10 MG PO TABS: 10.0000 mg | ORAL_TABLET | Freq: Every evening | ORAL | 0 refills | Status: DC | PRN

## 2017-06-27 NOTE — Telephone Encounter (Signed)
Rx has been called in. Pts wife is aware.

## 2017-06-27 NOTE — Telephone Encounter (Signed)
Spouse has called back in regard.  States pharmacy has not received this medication.  Please call once sent at [phone].

## 2017-06-27 NOTE — Telephone Encounter (Signed)
I will refill this medication.

## 2017-07-26 ENCOUNTER — Other Ambulatory Visit: Payer: Self-pay | Admitting: Nurse Practitioner

## 2017-07-28 ENCOUNTER — Telehealth: Payer: Self-pay | Admitting: Family

## 2017-07-28 MED ORDER — ZOLPIDEM TARTRATE 10 MG PO TABS: 10.0000 mg | ORAL_TABLET | Freq: Every evening | ORAL | 1 refills | Status: DC | PRN

## 2017-07-28 NOTE — Telephone Encounter (Signed)
Pt contacted pharmacy and refill for Ambien still not received.

## 2017-07-28 NOTE — Telephone Encounter (Signed)
Pt has appt scheduled w/Ashley 08/31/17 pls advise if ok to refill since Tammy SoursGreg is no longer here...Raechel Chute/lmb

## 2017-07-28 NOTE — Telephone Encounter (Signed)
Copied from CRM [id number]. Topic: Quick Communication - Rx Refill/Question >> Jul 28, 2017 11:50 AM Gerrianne ScalePayne, Gabrial Poppell L wrote: Has the patient contacted their pharmacy? Yes.     (Agent: If no, request that the patient contact the pharmacy for the refill.)   zolpidem (AMBIEN) 10 MG tablet will be pt second night without this medicine  Preferred Pharm[facility] (with phone number [facility] street name):[facility] [address]   Agent: Please be advised that RX refills may take up to 3 business days. We ask that you follow-up with your pharmacy.

## 2017-07-28 NOTE — Telephone Encounter (Signed)
Refill sent in

## 2017-07-29 NOTE — Telephone Encounter (Signed)
Called pt no answer LMOM rx has been [facility].../lmb

## 2017-08-18 ENCOUNTER — Telehealth: Payer: Self-pay | Admitting: Nurse Practitioner

## 2017-08-18 MED ORDER — OMEPRAZOLE 20 MG PO CPDR: 20.0000 mg | DELAYED_RELEASE_CAPSULE | Freq: Every day | ORAL | 0 refills | Status: DC

## 2017-08-18 NOTE — Telephone Encounter (Signed)
Will route to [facility] [place] pool Copied from CRM [id number]. Topic: Quick Communication - See Telephone Encounter >> Aug 18, 2017 11:29 AM Cipriano BunkerLambe, Annette S wrote: CRM for notification. See Telephone encounter for:   Ambien and Omeprazole refills needed.  He is out of medication. He has appt. To see Alphonse Guildshleigh Shambley on 08/26/17  [id number] [place], [place] - [address] [place] [address] Phone: [phone] Fax: [phone]    08/18/17.

## 2017-08-18 NOTE — Telephone Encounter (Signed)
Copied from CRM [id number]. Topic: Quick Communication - See Telephone Encounter >> Aug 18, 2017 11:29 AM Cipriano BunkerLambe, Annette S wrote: CRM for notification. See Telephone encounter for:   Ambien and Omeprazole refills needed.  He is out of medication. He has appt. With [facility] 08[facility]26[facility]17  [id number] [place] - [address] [place] [address] Phone: [phone] Fax: [phone]    08/18/17.

## 2017-08-18 NOTE — Telephone Encounter (Signed)
Duplicate msg... Already been taking care of...Raechel Chute/lmb

## 2017-08-18 NOTE — Telephone Encounter (Signed)
Notified pt sent omeprazole. [place] script has 1 refill left need to contact pharmacy to have refill...Raechel Chute/lmb

## 2017-08-26 ENCOUNTER — Other Ambulatory Visit (INDEPENDENT_AMBULATORY_CARE_PROVIDER_SITE_OTHER): Payer: BLUE CROSS/BLUE SHIELD

## 2017-08-26 ENCOUNTER — Ambulatory Visit (INDEPENDENT_AMBULATORY_CARE_PROVIDER_SITE_OTHER): Payer: BLUE CROSS/BLUE SHIELD | Admitting: Nurse Practitioner

## 2017-08-26 ENCOUNTER — Encounter: Payer: Self-pay | Admitting: Nurse Practitioner

## 2017-08-26 ENCOUNTER — Ambulatory Visit: Payer: BLUE CROSS/BLUE SHIELD | Admitting: Nurse Practitioner

## 2017-08-26 ENCOUNTER — Other Ambulatory Visit: Payer: Self-pay | Admitting: Nurse Practitioner

## 2017-08-26 VITALS — BP 142/100 | HR 87 | Temp 98.3°F | Resp 16 | Ht 70.0 in | Wt 203.6 lb

## 2017-08-26 DIAGNOSIS — G4700 Insomnia, unspecified: Secondary | ICD-10-CM | POA: Diagnosis not present

## 2017-08-26 DIAGNOSIS — G43909 Migraine, unspecified, not intractable, without status migrainosus: Secondary | ICD-10-CM | POA: Diagnosis not present

## 2017-08-26 DIAGNOSIS — K2100 Gastro-esophageal reflux disease with esophagitis, without bleeding: Secondary | ICD-10-CM

## 2017-08-26 DIAGNOSIS — F419 Anxiety disorder, unspecified: Secondary | ICD-10-CM | POA: Diagnosis not present

## 2017-08-26 DIAGNOSIS — E7800 Pure hypercholesterolemia, unspecified: Secondary | ICD-10-CM

## 2017-08-26 DIAGNOSIS — I10 Essential (primary) hypertension: Secondary | ICD-10-CM

## 2017-08-26 LAB — COMPREHENSIVE METABOLIC PANEL
ALBUMIN: 4.6 g/dL (ref 3.5–5.2)
ALK PHOS: 77 U/L (ref 39–117)
ALT: 22 U/L (ref 0–53)
AST: 16 U/L (ref 0–37)
BUN: 10 mg/dL (ref 6–23)
CALCIUM: 9.5 mg/dL (ref 8.4–10.5)
CO2: 28 mEq/L (ref 19–32)
Chloride: 104 mEq/L (ref 96–112)
Creatinine, Ser: 1.09 mg/dL (ref 0.40–1.50)
GFR: 79.99 mL/min (ref >60.00)
Glucose, Bld: 101 mg/dL — ABNORMAL HIGH (ref 70–99)
POTASSIUM: 4.5 meq/L (ref 3.5–5.1)
Sodium: 141 mEq/L (ref 135–145)
TOTAL PROTEIN: 7.3 g/dL (ref 6.0–8.3)
Total Bilirubin: 0.4 mg/dL (ref 0.2–1.2)

## 2017-08-26 LAB — CBC
HCT: 50.4 % (ref 39.0–52.0)
HEMOGLOBIN: 16.8 g/dL (ref 13.0–17.0)
MCHC: 33.4 g/dL (ref 30.0–36.0)
MCV: 88.2 fl (ref 78.0–100.0)
PLATELETS: 298 10*3/uL (ref 150.0–400.0)
RBC: 5.71 Mil/uL (ref 4.22–5.81)
RDW: 13.1 % (ref 11.5–15.5)
WBC: 9.7 10*3/uL (ref 4.0–10.5)

## 2017-08-26 LAB — LIPID PANEL
Cholesterol: 180 mg/dL (ref 0–200)
HDL: 34.4 mg/dL — AB (ref >39.00)
NONHDL: 145.12
TRIGLYCERIDES: 286 mg/dL — AB (ref 0.0–149.0)
Total CHOL/HDL Ratio: 5
VLDL: 57.2 mg/dL — ABNORMAL HIGH (ref 0.0–40.0)

## 2017-08-26 LAB — LDL CHOLESTEROL, DIRECT: Direct LDL: 111 mg/dL

## 2017-08-26 MED ORDER — OMEPRAZOLE 20 MG PO CPDR: 20.0000 mg | DELAYED_RELEASE_CAPSULE | Freq: Every day | ORAL | 0 refills | Status: DC

## 2017-08-26 MED ORDER — ZOLPIDEM TARTRATE 10 MG PO TABS: 10.0000 mg | ORAL_TABLET | Freq: Every evening | ORAL | 0 refills | Status: DC | PRN

## 2017-08-26 MED ORDER — PROPRANOLOL HCL ER 80 MG PO CP24: 80.0000 mg | ORAL_CAPSULE | Freq: Every day | ORAL | 1 refills | Status: DC

## 2017-08-26 MED ORDER — SERTRALINE HCL 100 MG PO TABS: 100.0000 mg | ORAL_TABLET | Freq: Every day | ORAL | 0 refills | Status: DC

## 2017-08-26 MED ORDER — ATORVASTATIN CALCIUM 40 MG PO TABS: 40.0000 mg | ORAL_TABLET | Freq: Every day | ORAL | 0 refills | Status: DC

## 2017-08-26 MED ORDER — ENALAPRIL MALEATE 5 MG PO TABS: 5.0000 mg | ORAL_TABLET | Freq: Every day | ORAL | 0 refills | Status: DC

## 2017-08-26 MED ORDER — PROPRANOLOL HCL ER 80 MG PO CP24: 80.0000 mg | ORAL_CAPSULE | Freq: Every day | ORAL | 0 refills | Status: DC

## 2017-08-26 NOTE — Assessment & Plan Note (Signed)
Long history of migraines which have not worsened. He does have about 2 per week We discussed starting a daily preventive medication due to frequency of migraines, and he is agreeable. Medications ordered: - propranolol ER (INDERAL LA) 80 MG 24 hr capsule; Take 1 capsule (80 mg total) by mouth daily.  Dispense: 90 capsule; Refill: 0 He will keep a headache log and RTC in 1 month for follow up of migraines.

## 2017-08-26 NOTE — Addendum Note (Signed)
Addended by: Evaristo BurySHAMBLEY, ASHLEIGH N on: 08/26/2017 12:44 PM   Modules accepted: Level of Service

## 2017-08-26 NOTE — Progress Notes (Addendum)
Subjective:    Patient ID: Cory Hoffman, male    DOB: 05-10-1976, 42 y.o.   MRN: [id number] mir HPI  Mr Cory Hoffman is a 42 yo male who presents today to establish care. He is transferring to me from another provider in the same clinic. He Has the following significant medical problems: HTN, OSA, GERD. He is here today with no complaints requesting refills of his daily medications.  Hypertension -maintained on enalapril 5 daily. Reports he has not been taking his medication for about 1.5 weeks, he had no refill. Reports daily medication compliance without adverse medication effects. Denies headaches, vision changes, chest pain, shortness of breath, edema.  BP Readings from Last 3 Encounters:  08/26/17 (!) 142/100  03/14/17 118/84  09/24/15 136/82    Cholesterol- maintained on atorvastatin 40. Reports daily medication compliance without adverse medication effects including myalgias. Diet- heavy fast food due to frequent travel; does cook at home at night. Does eat veggies with dinner. Exercise- walks about 4 miles a day.  GERD- maintained on prilosec. He reports taking the medication daily without adverse effects. If he misses a dose he experiences burning in his chest and throat, especially if lying flat.  Anxiety-maintained on zoloft 100 daily. He reports taking the medication daily without adverse reactions including dizziness or drowsiness.  He denies thoughts of harming himself or others. He feels his anxiety is well controlled on the zoloft.  Migraines- This is a chronic problem. The migraines began about 15 years ago. The migraines have not gotten worse over time. He has been maintained on imitrex but says he has to take it about twice a week. He describes his migraines as pressure behind both eyes. He feels nauseated and sometimes vomits when he gets the headaches. The headaches last for hours to days, and occasionally he has to leave Bright lights make the headaches  worse. Denies syncope, weakness, confusion. Has was on a daily preventive medication years ago but did not feel it helped, he can not recall the name.  Insomnia- maintained on ambien.10 at bedtime He takes the [place] every night. He is able to sleep about 6 hours each night  He is not able to sleep without the [place]ien, he went days without sleep before he started the [place]ien Denies hallucinations, disorientation, lethargy, impaired memory.  Review of Systems  See HPI  Past Medical History:  Diagnosis Date  . Chronic headaches   . Frequent headaches   . HTN (hypertension)   . Hyperlipidemia   . Migraines      Social History   Socioeconomic History  . Marital status: Married    Spouse name: Not on file  . Number of children: 4  . Years of education: 5916  . Highest education level: Not on file  Social Needs  . Financial resource strain: Not on file  . Food insecurity - worry: Not on file  . Food insecurity - inability: Not on file  . Transportation needs - medical: Not on file  . Transportation needs - non-medical: Not on file  Occupational History  . Occupation: Data processing managerBranch manager  Tobacco Use  . Smoking status: Current Every Day Smoker    Packs/day: 1.00    Years: 15.00    Pack years: 15.00    Types: Cigarettes  . Smokeless tobacco: Never Used  Substance and Sexual Activity  . Alcohol use: Yes    Alcohol/week: 0.0 oz    Comment: social/weekly  . Drug use: No  . Sexual activity:  Yes  Other Topics Concern  . Not on file  Social History Narrative   Fun/Hobby: Racing, Photographer.     Past Surgical History:  Procedure Laterality Date  . BACK SURGERY    . KNEE ARTHROSCOPY    . WISDOM TOOTH EXTRACTION      Family History  Problem Relation Age of Onset  . Heart disease Mother   . Cancer - Lung Mother   . Heart disease Father   . Stroke Paternal Grandmother   . Heart attack Paternal Grandfather        Mid 28s    No Known Allergies  Current Outpatient  Medications on File Prior to Visit  Medication Sig Dispense Refill  . atorvastatin (LIPITOR) 40 MG tablet Take 1 tablet (40 mg total) by mouth daily. 90 tablet 0  . enalapril (VASOTEC) 5 MG tablet Take 1 tablet (5 mg total) by mouth daily. 90 tablet 1  . HYDROcodone-acetaminophen (NORCO) 5-325 MG tablet Take 1 tablet by mouth every 8 (eight) hours as needed for moderate pain or severe pain. 15 tablet 0  . omeprazole (PRILOSEC) 20 MG capsule Take 1 capsule (20 mg total) by mouth daily. Must keep scheduled appt w/new provider for refills 30 capsule 0  . sertraline (ZOLOFT) 100 MG tablet Take 1 tablet (100 mg total) by mouth daily. 30 tablet 3  . SUMAtriptan (IMITREX) 50 MG tablet Take 50 mg by mouth every 2 (two) hours as needed for migraine. May repeat in 2 hours if headache persists or recurs.    . traMADol (ULTRAM) 50 MG tablet Take 1 tablet (50 mg total) by mouth every 8 (eight) hours as needed. 60 tablet 0  . zolpidem (AMBIEN) 10 MG tablet Take 1 tablet (10 mg total) by mouth at bedtime as needed for sleep. Needs to keep visit for any more refills 30 tablet 1   No current facility-administered medications on file prior to visit.     BP (!) 142/100 (BP Location: Left Arm, Patient Position: Sitting, Cuff Size: Large)   Pulse 87   Temp 98.3 F (36.8 C) (Oral)   Resp 16   Ht 5\' 10"  (1.778 m)   Wt 203 lb 9.6 oz (92.4 kg)   SpO2 97%   BMI 29.21 kg/m        Objective:   Physical Exam  Constitutional: He is oriented to person, place, and time. He appears well-developed and well-nourished. No distress.  HENT:  Head: Normocephalic and atraumatic.  Eyes: EOM are normal. Pupils are equal, round, and reactive to light.  Neck: Normal range of motion. Neck supple.  Cardiovascular: Normal rate, regular rhythm, normal heart sounds and intact distal pulses.  Pulmonary/Chest: Effort normal and breath sounds normal.  Musculoskeletal: He exhibits no edema.  Neurological: He is alert and oriented  to person, place, and time. He has normal strength and normal reflexes. No cranial nerve deficit. Coordination and gait normal.  Skin: Skin is warm and dry.  Psychiatric: He has a normal mood and affect. Judgment and thought content normal.      Assessment & Plan:   A total of 40  minutes were spent face-to-face with the patient during this encounter and over half of that time was spent on counseling and coordination of care. The patient was counseled on medications including controlled substances and chronic conditions.

## 2017-08-26 NOTE — Assessment & Plan Note (Signed)
-   sertraline (ZOLOFT) 100 MG tablet; Take 1 tablet (100 mg total) by mouth daily.  Dispense: 90 tablet; Refill: 0

## 2017-08-26 NOTE — Progress Notes (Signed)
Med rec

## 2017-08-26 NOTE — Assessment & Plan Note (Signed)
Medications ordered: - zolpidem (AMBIEN) 10 MG tablet; Take 1 tablet (10 mg total) by mouth at bedtime as needed for sleep.  Dispense: 30 tablet; Refill:  Controlled substance registry reviewed with no irregularities. We may need to consider [place]ien as a contributor to his headaches, anxiety, and elevated blood pressure in the future if these conditions remain uncontrolled, although it does appear that hes had headaches and elevated blood pressure for some time, likely before starting the [place]

## 2017-08-26 NOTE — Assessment & Plan Note (Signed)
Has been maintained on atorvastatin for some time with no complaints of adverse reactions. No lipid panel on file. Diagnostic testing ordered: - Lipid panel; Future Medications ordered: - atorvastatin (LIPITOR) 40 MG tablet; Take 1 tablet (40 mg total) by mouth daily.  Dispense: 90 tablet; Refill: 0

## 2017-08-26 NOTE — Assessment & Plan Note (Signed)
BP elevated today but he has been off his medication for over 1 week due to no refills. Normal readings in the past when taking his medication as prescribed. Discussed importance of taking medication daily along with healthy diet and exercise. Diagnostic testing ordered: - CBC; Future - Comprehensive metabolic panel; Future Medications ordered: - enalapril (VASOTEC) 5 MG tablet; Take 1 tablet (5 mg total) by mouth daily.  Dispense: 90 tablet; Refill: 0 RTC in 1 month for re-check

## 2017-08-26 NOTE — Assessment & Plan Note (Signed)
-   omeprazole (PRILOSEC) 20 MG capsule; Take 1 capsule (20 mg total) by mouth daily. Must keep scheduled appt w/new provider for refills  Dispense: 90 capsule; Refill: 0

## 2017-08-26 NOTE — Patient Instructions (Addendum)
Please resume your enalapril 5 daily for your blood pressure.  Please start propanolol (Inderal LA) 80 mg once daily for your migraines. Please keep a headache log of the days you have headaches.  Id like to see you back in 1 month to see how your migraines are doing and see if your blood pressure has improved.  Please work on your diet and exercise as we discussed. Remember half of your plate should be veggies, one-fourth carbs, one-fourth meat, and don't eat meat at every meal. Also, remember to stay away from sugary drinks. I'd like for you to start incorporating exercise into your daily schedule. Start at 10 minutes a day, working up to 30 minutes five times a week.   It was nice to meet you. Thanks for letting me take care of you today :)

## 2017-09-19 ENCOUNTER — Ambulatory Visit (INDEPENDENT_AMBULATORY_CARE_PROVIDER_SITE_OTHER): Payer: BLUE CROSS/BLUE SHIELD | Admitting: Orthopaedic Surgery

## 2017-09-19 ENCOUNTER — Encounter (INDEPENDENT_AMBULATORY_CARE_PROVIDER_SITE_OTHER): Payer: Self-pay | Admitting: Orthopaedic Surgery

## 2017-09-19 DIAGNOSIS — M23204 Derangement of unspecified medial meniscus due to old tear or injury, left knee: Secondary | ICD-10-CM | POA: Diagnosis not present

## 2017-09-19 DIAGNOSIS — G8929 Other chronic pain: Secondary | ICD-10-CM | POA: Diagnosis not present

## 2017-09-19 DIAGNOSIS — M25562 Pain in left knee: Secondary | ICD-10-CM | POA: Diagnosis not present

## 2017-09-19 DIAGNOSIS — M1712 Unilateral primary osteoarthritis, left knee: Secondary | ICD-10-CM | POA: Insufficient documentation

## 2017-09-19 MED ORDER — MELOXICAM 7.5 MG PO TABS: 7.5000 mg | ORAL_TABLET | Freq: Every day | ORAL | 2 refills | Status: DC | PRN

## 2017-09-19 MED ORDER — TRAMADOL HCL 50 MG PO TABS: 50.0000 mg | ORAL_TABLET | Freq: Four times a day (QID) | ORAL | 0 refills | Status: DC | PRN

## 2017-09-19 NOTE — Progress Notes (Signed)
Office Visit Note   Patient: Cory Hoffman           Date of Birth: February 10, 1976           MRN: [id number] Visit Date: 09/19/2017              Requested by: Evaristo BuryShambley, Ashleigh N, NP [address] [place], [place] [id number] PCP: Evaristo BuryShambley, Ashleigh N, NP   Assessment & Plan: Visit Diagnoses:  1. Degenerative tear of medial meniscus of left knee   2. Chronic pain of left knee     Plan: At this point, [facility] as well as an MRI of his left knee to assess his medial meniscus.  I have given him a prescription for Mobic as well as tramadol to use in the meantime.  He will follow-up with us once his MRI is completed.  Follow-Up Instructions: Return for following MRI.   Orders:  Orders Placed This Encounter  Procedures  . MR Knee Left w/o contrast  . DG Knee 3 Views Left   Meds ordered this encounter  Medications  . traMADol (ULTRAM) 50 MG tablet    Sig: Take 1 tablet (50 mg total) by mouth every 6 (six) hours as needed.    Dispense:  30 tablet    Refill:  0    Order Specific Question:   Supervising Provider    Answer:   Tarry KosXU, NAIPING M [[id number]]  . meloxicam (MOBIC) 7.5 MG tablet    Sig: Take 1 tablet (7.5 mg total) by mouth daily as needed for pain.    Dispense:  30 tablet    Refill:  2    Order Specific Question:   Supervising Provider    Answer:   Tarry KosXU, NAIPING M [[id number]]      Procedures: No procedures performed   Clinical Data: No additional findings.   Subjective: Chief Complaint  Patient presents with  . Left Knee - Pain    HPI Cory Hoffman is a pleasant 42 year old gentleman who presents to our clinic today with left knee pain.  He is 10 years out from a left knee arthroscopic meniscectomy by a physician in [place].  He says that he has had pain ever since.  It is progressively worsened.  All of his pain is medial aspect.  He describes this as a constant ache with hot poker type pains when he is  climbing ladders pivoting or flexing the knee.  He also describes a fair amount of walking and catching.  No instability.  He has tried over-the-counter medications with no relief of symptoms.  He does note that he had a cortisone injection approximately 2 years ago which was of minimal relief.  Review of Systems as detailed in HPI.  All others reviewed and are negative.   Objective: Vital Signs: There were no vitals taken for this visit.  Physical Exam well-developed well-nourished gentleman in no acute distress.  Alert and oriented x3.  Ortho Exam examination of the left knee reveals no effusion.  Range of motion 0-110 degrees.  Marked tenderness medial meniscus with a positive medial McMurray.  Moderate patellofemoral crepitus.  Ligaments are stable.  He is neurovascular intact distally.  Specialty Comments:  No specialty comments available.  Imaging: No results found.   PMFS History: Patient Active Problem List   Diagnosis Date Noted  . Degenerative tear of medial meniscus of left knee 09/19/2017  . Insomnia 08/26/2017  .  Anxiety 08/26/2017  . Hypercholesteremia 08/26/2017  . Migraine without status migrainosus, not intractable 08/26/2017  . Essential hypertension 03/14/2017  . Gastroesophageal reflux disease with esophagitis 03/14/2017  . OSA (obstructive sleep apnea) 09/24/2015   Past Medical History:  Diagnosis Date  . Chronic headaches   . Frequent headaches   . HTN (hypertension)   . Hyperlipidemia   . Migraines     Family History  Problem Relation Age of Onset  . Heart disease Mother   . Cancer - Lung Mother   . Heart disease Father   . Stroke Paternal Grandmother   . Heart attack Paternal Grandfather        Mid 26s    Past Surgical History:  Procedure Laterality Date  . BACK SURGERY    . KNEE ARTHROSCOPY    . WISDOM TOOTH EXTRACTION     Social History   Occupational History  . Occupation: Data processing manager  Tobacco Use  . Smoking status: Current  Every Day Smoker    Packs/day: 1.00    Years: 15.00    Pack years: 15.00    Types: Cigarettes  . Smokeless tobacco: Never Used  Substance and Sexual Activity  . Alcohol use: Yes    Alcohol/week: 0.0 oz    Comment: social/weekly  . Drug use: No  . Sexual activity: Yes

## 2017-09-20 ENCOUNTER — Ambulatory Visit
Admission: RE | Admit: 2017-09-20 | Discharge: 2017-09-20 | Disposition: A | Discharge status: Home / Self Care | Payer: BLUE CROSS/BLUE SHIELD | Source: Ambulatory Visit | Attending: Orthopaedic Surgery | Admitting: Orthopaedic Surgery

## 2017-09-20 ENCOUNTER — Other Ambulatory Visit (INDEPENDENT_AMBULATORY_CARE_PROVIDER_SITE_OTHER): Payer: Self-pay | Admitting: Orthopaedic Surgery

## 2017-09-20 DIAGNOSIS — M25562 Pain in left knee: Principal | ICD-10-CM

## 2017-09-20 DIAGNOSIS — G8929 Other chronic pain: Secondary | ICD-10-CM

## 2017-09-22 ENCOUNTER — Ambulatory Visit
Admission: RE | Admit: 2017-09-22 | Discharge: 2017-09-22 | Disposition: A | Discharge status: Home / Self Care | Payer: BLUE CROSS/BLUE SHIELD | Source: Ambulatory Visit | Attending: Orthopaedic Surgery | Admitting: Orthopaedic Surgery

## 2017-09-22 DIAGNOSIS — G8929 Other chronic pain: Secondary | ICD-10-CM

## 2017-09-22 DIAGNOSIS — M25562 Pain in left knee: Principal | ICD-10-CM

## 2017-09-22 NOTE — Progress Notes (Signed)
MRI

## 2017-09-23 NOTE — Progress Notes (Signed)
Fu appt to discuss

## 2017-09-26 ENCOUNTER — Other Ambulatory Visit: Payer: BLUE CROSS/BLUE SHIELD

## 2017-09-26 NOTE — Progress Notes (Deleted)
Name: Cory Hoffman   MRN: [id number]    DOB: 1975/11/04   Date:09/26/2017       Progress Note  Subjective  Chief Complaint  No chief complaint on file.   HPI  LAST VISIT 1/4Migraines- This is a chronic problem. The migraines began about 15 years ago. The migraines have not gotten worse over time. He has been maintained on imitrex but says he has to take it about twice a week. He describes his migraines as pressure behind both eyes. He feels nauseated and sometimes vomits when he gets the headaches. The headaches last for hours to days, and occasionally he has to leave Bright lights make the headaches worse. Denies syncope, weakness, confusion. Has was on a daily preventive medication years ago but did not feel it helped, he can not recall the name.  He was started on daily preventive medication propanolol 80 and instructed to keep headache log and rtc in 1 month  Patient Active Problem List   Diagnosis Date Noted  . Degenerative tear of medial meniscus of left knee 09/19/2017  . Insomnia 08/26/2017  . Anxiety 08/26/2017  . Hypercholesteremia 08/26/2017  . Migraine without status migrainosus, not intractable 08/26/2017  . Essential hypertension 03/14/2017  . Gastroesophageal reflux disease with esophagitis 03/14/2017  . OSA (obstructive sleep apnea) 09/24/2015    Past Surgical History:  Procedure Laterality Date  . BACK SURGERY    . KNEE ARTHROSCOPY    . WISDOM TOOTH EXTRACTION      Family History  Problem Relation Age of Onset  . Heart disease Mother   . Cancer - Lung Mother   . Heart disease Father   . Stroke Paternal Grandmother   . Heart attack Paternal Grandfather        Mid 33s    Social History   Socioeconomic History  . Marital status: Married    Spouse name: Not on file  . Number of children: 4  . Years of education: 33  . Highest education level: Not on file  Social Needs  . Financial resource strain: Not on file  . Food insecurity - worry: Not  on file  . Food insecurity - inability: Not on file  . Transportation needs - medical: Not on file  . Transportation needs - non-medical: Not on file  Occupational History  . Occupation: Data processing manager  Tobacco Use  . Smoking status: Current Every Day Smoker    Packs/day: 1.00    Years: 15.00    Pack years: 15.00    Types: Cigarettes  . Smokeless tobacco: Never Used  Substance and Sexual Activity  . Alcohol use: Yes    Alcohol/week: 0.0 oz    Comment: social/weekly  . Drug use: No  . Sexual activity: Yes  Other Topics Concern  . Not on file  Social History Narrative   Fun/Hobby: Racing, Photographer.      Current Outpatient Medications:  .  atorvastatin (LIPITOR) 40 MG tablet, Take 1 tablet (40 mg total) by mouth daily., Disp: 90 tablet, Rfl: 0 .  enalapril (VASOTEC) 5 MG tablet, Take 1 tablet (5 mg total) by mouth daily., Disp: 90 tablet, Rfl: 0 .  meloxicam (MOBIC) 7.5 MG tablet, Take 1 tablet (7.5 mg total) by mouth daily as needed for pain., Disp: 30 tablet, Rfl: 2 .  omeprazole (PRILOSEC) 20 MG capsule, Take 1 capsule (20 mg total) by mouth daily. Must keep scheduled appt w/new provider for refills, Disp: 90 capsule, Rfl: 0 .  propranolol  ER (INDERAL LA) 80 MG 24 hr capsule, Take 1 capsule (80 mg total) by mouth daily., Disp: 90 capsule, Rfl: 0 .  sertraline (ZOLOFT) 100 MG tablet, Take 1 tablet (100 mg total) by mouth daily., Disp: 90 tablet, Rfl: 0 .  SUMAtriptan (IMITREX) 50 MG tablet, Take 50 mg by mouth every 2 (two) hours as needed for migraine. May repeat in 2 hours if headache persists or recurs., Disp: , Rfl:  .  traMADol (ULTRAM) 50 MG tablet, Take 1 tablet (50 mg total) by mouth every 6 (six) hours as needed., Disp: 30 tablet, Rfl: 0 .  zolpidem (AMBIEN) 10 MG tablet, Take 1 tablet (10 mg total) by mouth at bedtime as needed for sleep., Disp: 30 tablet, Rfl: 0  No Known Allergies   ROS  ***  Objective  There were no vitals filed for this  visit. ***  There is no height or weight on file to calculate BMI.  Physical Exam ***  Recent Results (from the past 2160 hour(s))  Lipid panel     Status: Abnormal   Collection Time: 08/26/17  8:59 AM  Result Value Ref Range   Cholesterol 180 0 - 200 mg/dL    Comment: ATP III Classification       Desirable:  < 200 mg/dL               Borderline High:  200 - 239 mg/dL          High:  > = 161240 mg/dL   Triglycerides 096.0286.0 (H) 0.0 - 149.0 mg/dL    Comment: Normal:  <454<150 mg/dLBorderline High:  150 - 199 mg/dL   HDL 09.8134.40 (L) >19.14>39.00 mg/dL   VLDL 78.257.2 (H) 0.0 - 95.640.0 mg/dL   Total CHOL/HDL Ratio 5     Comment:                Men          Women1/2 Average Risk     3.4          3.3Average Risk          5.0          4.42X Average Risk          9.6          7.13X Average Risk          15.0          11.0                       NonHDL 145.12     Comment: NOTE:  Non-HDL goal should be 30 mg/dL higher than patient's LDL goal (i.e. LDL goal of < 70 mg/dL, would have non-HDL goal of < 100 mg/dL)  Comprehensive metabolic panel     Status: Abnormal   Collection Time: 08/26/17  8:59 AM  Result Value Ref Range   Sodium 141 135 - 145 mEq/L   Potassium 4.5 3.5 - 5.1 mEq/L   Chloride 104 96 - 112 mEq/L   CO2 28 19 - 32 mEq/L   Glucose, Bld 101 (H) 70 - 99 mg/dL   BUN 10 6 - 23 mg/dL   Creatinine, Ser 2.131.09 0.40 - 1.50 mg/dL   Total Bilirubin 0.4 0.2 - 1.2 mg/dL   Alkaline Phosphatase 77 39 - 117 U/L   AST 16 0 - 37 U/L   ALT 22 0 - 53 U/L   Total Protein 7.3 6.0 - 8.3 g/dL  Albumin 4.6 3.5 - 5.2 g/dL   Calcium 9.5 8.4 - 16.1 mg/dL   GFR 09.60 >45.40 mL/min  CBC     Status: None   Collection Time: 08/26/17  8:59 AM  Result Value Ref Range   WBC 9.7 4.0 - 10.5 K/uL   RBC 5.71 4.22 - 5.81 Mil/uL   Platelets 298.0 150.0 - 400.0 K/uL   Hemoglobin 16.8 13.0 - 17.0 g/dL   HCT 98.1 19.1 - 47.8 %   MCV 88.2 78.0 - 100.0 fl   MCHC 33.4 30.0 - 36.0 g/dL   RDW 29.5 62.1 - 30.8 %  LDL cholesterol, direct      Status: None   Collection Time: 08/26/17  8:59 AM  Result Value Ref Range   Direct LDL 111.0 mg/dL    Comment: Optimal:  <657 mg/dLNear or Above Optimal:  100-129 mg/dLBorderline High:  130-159 mg/dLHigh:  160-189 mg/dLVery High:  >190 mg/dL    Diabetic Foot Exam: Diabetic Foot Exam - Simple   No data filed     ***  PHQ2/9: Depression screen PHQ 2/9 03/14/2017  Decreased Interest 0  Down, Depressed, Hopeless 0  PHQ - 2 Score 0   ***  Fall Risk: No flowsheet data found. ***   Functional Status Survey:   ***  Assessment & Plan  There are no diagnoses linked to this encounter.  -Reviewed Health Maintenance: ***

## 2017-09-27 ENCOUNTER — Ambulatory Visit (INDEPENDENT_AMBULATORY_CARE_PROVIDER_SITE_OTHER): Payer: BLUE CROSS/BLUE SHIELD | Admitting: Internal Medicine

## 2017-09-27 ENCOUNTER — Ambulatory Visit: Payer: BLUE CROSS/BLUE SHIELD | Admitting: Nurse Practitioner

## 2017-09-27 ENCOUNTER — Encounter: Payer: Self-pay | Admitting: Internal Medicine

## 2017-09-27 VITALS — BP 140/94 | HR 84 | Temp 99.0°F | Resp 16 | Wt 211.0 lb

## 2017-09-27 DIAGNOSIS — G43909 Migraine, unspecified, not intractable, without status migrainosus: Secondary | ICD-10-CM

## 2017-09-27 DIAGNOSIS — J069 Acute upper respiratory infection, unspecified: Secondary | ICD-10-CM | POA: Diagnosis not present

## 2017-09-27 DIAGNOSIS — I10 Essential (primary) hypertension: Secondary | ICD-10-CM

## 2017-09-27 MED ORDER — VERAPAMIL HCL ER 120 MG PO TBCR: 120.0000 mg | EXTENDED_RELEASE_TABLET | Freq: Every day | ORAL | 5 refills | Status: DC

## 2017-09-27 MED ORDER — ZOLMITRIPTAN 5 MG PO TABS: 5.0000 mg | ORAL_TABLET | ORAL | 0 refills | Status: DC | PRN

## 2017-09-27 NOTE — Assessment & Plan Note (Signed)
Likely viral Improving on own No treatment He will call at end of week if symptoms do not continue to improve

## 2017-09-27 NOTE — Patient Instructions (Signed)
Take verapamil daily at night for high blood pressure and for migraine prevention.    We will also try zomig for your migraines.

## 2017-09-27 NOTE — Progress Notes (Signed)
Subjective:    Patient ID: Cory Hoffman, male    DOB: Mar 10, 1976, 42 y.o.   MRN: [id number]  HPI The patient is here for an acute visit.  He had lasik surgery 15 years ago.  6 mo- 1 year ago he started having migraines.  He has aura and nausea.  The migraines  can be severe at times.  He has decreased his stress level and that has helped. His last migraine was two weeks ago.  He start propranolol one month ago and he did not like it - he quit taking it.  He has seen a headache specialist in the past and was on several medications.    Cold symptoms:  He had a cold two weeks ago and he thinks it is finally resolving.  He still has some nasal congestions, ear pain, PND, cough and wheezing at night.    HTN:  He stopped the BP medication due to not feeling well.  At home off of medication 130's usually, occasionally 160's.  He denies chest pain, palpitations.      Medications and allergies reviewed with patient and updated if appropriate.  Patient Active Problem List   Diagnosis Date Noted  . Degenerative tear of medial meniscus of left knee 09/19/2017  . Insomnia 08/26/2017  . Anxiety 08/26/2017  . Hypercholesteremia 08/26/2017  . Migraine without status migrainosus, not intractable 08/26/2017  . Essential hypertension 03/14/2017  . Gastroesophageal reflux disease with esophagitis 03/14/2017  . OSA (obstructive sleep apnea) 09/24/2015    Current Outpatient Medications on File Prior to Visit  Medication Sig Dispense Refill  . atorvastatin (LIPITOR) 40 MG tablet Take 1 tablet (40 mg total) by mouth daily. 90 tablet 0  . omeprazole (PRILOSEC) 20 MG capsule Take 1 capsule (20 mg total) by mouth daily. Must keep scheduled appt w/new provider for refills 90 capsule 0  . SUMAtriptan (IMITREX) 50 MG tablet Take 50 mg by mouth every 2 (two) hours as needed for migraine. May repeat in 2 hours if headache persists or recurs.    Marland Kitchen zolpidem (AMBIEN) 10 MG tablet Take 1 tablet (10 mg total)  by mouth at bedtime as needed for sleep. 30 tablet 0  . enalapril (VASOTEC) 5 MG tablet Take 1 tablet (5 mg total) by mouth daily. (Patient not taking: Reported on 09/27/2017) 90 tablet 0  . propranolol ER (INDERAL LA) 80 MG 24 hr capsule Take 1 capsule (80 mg total) by mouth daily. (Patient not taking: Reported on 09/27/2017) 90 capsule 0   No current facility-administered medications on file prior to visit.     Past Medical History:  Diagnosis Date  . Chronic headaches   . Frequent headaches   . HTN (hypertension)   . Hyperlipidemia   . Migraines     Past Surgical History:  Procedure Laterality Date  . BACK SURGERY    . KNEE ARTHROSCOPY    . WISDOM TOOTH EXTRACTION      Social History   Socioeconomic History  . Marital status: Married    Spouse name: None  . Number of children: 4  . Years of education: 35  . Highest education level: None  Social Needs  . Financial resource strain: None  . Food insecurity - worry: None  . Food insecurity - inability: None  . Transportation needs - medical: None  . Transportation needs - non-medical: None  Occupational History  . Occupation: Data processing manager  Tobacco Use  . Smoking status: Current Every Day Smoker  Packs/day: 1.00    Years: 15.00    Pack years: 15.00    Types: Cigarettes  . Smokeless tobacco: Never Used  Substance and Sexual Activity  . Alcohol use: Yes    Alcohol/week: 0.0 oz    Comment: social/weekly  . Drug use: No  . Sexual activity: Yes  Other Topics Concern  . None  Social History Narrative   Fun/Hobby: Art gallery manageracing, Photographerdrag racing.     Family History  Problem Relation Age of Onset  . Heart disease Mother   . Cancer - Lung Mother   . Heart disease Father   . Stroke Paternal Grandmother   . Heart attack Paternal Grandfather        Mid 8850s    Review of Systems  Constitutional: Negative for chills and fever.  HENT: Positive for congestion, ear pain and postnasal drip. Negative for sore throat.     Respiratory: Positive for cough and wheezing (at night). Negative for shortness of breath.   Cardiovascular: Negative for chest pain and palpitations.  Neurological: Positive for headaches.       Objective:   Vitals:   09/27/17 1450  BP: (!) 140/94  Pulse: 84  Resp: 16  Temp: 99 F (37.2 C)  SpO2: 97%   Wt Readings from Last 3 Encounters:  09/27/17 211 lb (95.7 kg)  08/26/17 203 lb 9.6 oz (92.4 kg)  03/14/17 209 lb (94.8 kg)   Body mass index is 30.28 kg/m.   Physical Exam   GENERAL APPEARANCE: Appears stated age, well appearing, NAD EYES: conjunctiva clear, no icterus HEENT: bilateral tympanic membranes and ear canals normal, oropharynx with mild erythema, no thyromegaly, trachea midline, no cervical or supraclavicular lymphadenopathy LUNGS: Clear to auscultation without wheeze or crackles, unlabored breathing, good air entry bilaterally CARDIOVASCULAR: Normal S1,S2 without murmurs, no edema SKIN: Warm, dry    Assessment & Plan:    See Problem List for Assessment and Plan of chronic medical problems.

## 2017-09-27 NOTE — Assessment & Plan Note (Signed)
Not controlled Did not tolerate vasotec Start verapamil 120 mg daily

## 2017-09-27 NOTE — Assessment & Plan Note (Signed)
Has been on several medications in the past Recently started on propranolol and he did not feel well - he stopped it Will try verapamil - hopefully that will help - may control his BP as well If he does not feel well on it he will let his PCP know Trial of zomig - will see if that is more effective than imitrex

## 2017-09-30 ENCOUNTER — Ambulatory Visit (INDEPENDENT_AMBULATORY_CARE_PROVIDER_SITE_OTHER): Payer: BLUE CROSS/BLUE SHIELD | Admitting: Orthopaedic Surgery

## 2017-09-30 ENCOUNTER — Encounter (INDEPENDENT_AMBULATORY_CARE_PROVIDER_SITE_OTHER): Payer: Self-pay | Admitting: Orthopaedic Surgery

## 2017-09-30 ENCOUNTER — Telehealth (INDEPENDENT_AMBULATORY_CARE_PROVIDER_SITE_OTHER): Payer: Self-pay

## 2017-09-30 DIAGNOSIS — M1712 Unilateral primary osteoarthritis, left knee: Secondary | ICD-10-CM | POA: Diagnosis not present

## 2017-09-30 MED ORDER — METHYLPREDNISOLONE ACETATE 40 MG/ML IJ SUSP
40.0000 mg | INTRAMUSCULAR | Status: AC | PRN
  Administered 2017-09-30: 40 mg via INTRA_ARTICULAR

## 2017-09-30 MED ORDER — LIDOCAINE HCL 1 % IJ SOLN
2.0000 mL | INTRAMUSCULAR | Status: AC | PRN
  Administered 2017-09-30: 2 mL

## 2017-09-30 MED ORDER — BUPIVACAINE HCL 0.25 % IJ SOLN
2.0000 mL | INTRAMUSCULAR | Status: AC | PRN
  Administered 2017-09-30: 2 mL via INTRA_ARTICULAR

## 2017-09-30 NOTE — Telephone Encounter (Signed)
Submitted [employer] online for Verification of Benefits due to Monovisc not being a preferred drug for just [employer].

## 2017-09-30 NOTE — Progress Notes (Signed)
Office Visit Note   Patient: Cory Hoffman           Date of Birth: 23-Apr-1976           MRN: [id number] Visit Date: 09/30/2017              Requested by: Evaristo BuryShambley, Ashleigh N, NP [address] [place], [place] [address] PCP: Evaristo BuryShambley, Ashleigh N, NP   Assessment & Plan: Visit Diagnoses:  1. Unilateral primary osteoarthritis, left knee       Follow-Up Instructions: Return if symptoms worsen or fail to improve.   Orders:  No orders of the defined types were placed in this encounter.  No orders of the defined types were placed in this encounter.     Procedures: Large Joint Inj: L knee on 09/30/2017 8:30 AM Indications: pain Details: 22 G needle, anterolateral approach Medications: 2 mL lidocaine 1 %; 2 mL bupivacaine 0.25 %; 40 mg methylPREDNISolone acetate 40 MG/ML      Clinical Data: No additional findings.   Subjective: Chief Complaint  Patient presents with  . Left Knee - Pain, Follow-up    HPI/PLAN  Devarious IS here to discuss MRI results of his left knee.  MRI results of the left knee reveal focal partial thickness fissuring cartilage of the patella.  Otherwise no structural abnormalities.  [name] continues to have pain today.  It is been 2 years since his last cortisone injection which was of minimal relief.  At this point, due to the absence of meniscal or ligamentous pathology we feels appropriate to proceed with another cortisone injection today.  We are also going to get approval for Visco supplementation injections for if the cortisone fails.  He will call and let us know in the next 2 weeks if he is not any better and we will have him come in for his Visco supplementation injection so long as it is been approved.  Review of Systems as detailed in HPI.  Allergies are negative.   Objective: Vital Signs: There were no vitals taken for this visit.  Physical Exam well-developed well-nourished gentleman in no acute distress.  Alert and oriented  x3.   Specialty Comments:  No specialty comments available.  Imaging: No new imaging today.   PMFS History: Patient Active Problem List   Diagnosis Date Noted  . URI (upper respiratory infection) 09/27/2017  . Unilateral primary osteoarthritis, left knee 09/19/2017  . Insomnia 08/26/2017  . Anxiety 08/26/2017  . Hypercholesteremia 08/26/2017  . Migraine without status migrainosus, not intractable 08/26/2017  . Essential hypertension 03/14/2017  . Gastroesophageal reflux disease with esophagitis 03/14/2017  . OSA (obstructive sleep apnea) 09/24/2015   Past Medical History:  Diagnosis Date  . Chronic headaches   . Frequent headaches   . HTN (hypertension)   . Hyperlipidemia   . Migraines     Family History  Problem Relation Age of Onset  . Heart disease Mother   . Cancer - Lung Mother   . Heart disease Father   . Stroke Paternal Grandmother   . Heart attack Paternal Grandfather        Mid 3250s    Past Surgical History:  Procedure Laterality Date  . BACK SURGERY    . KNEE ARTHROSCOPY    . WISDOM TOOTH EXTRACTION     Social History   Occupational History  . Occupation: Data processing managerBranch manager  Tobacco Use  . Smoking status: Current Every Day Smoker    Packs/day: 1.00    Years: 15.00  Pack years: 15.00    Types: Cigarettes  . Smokeless tobacco: Never Used  Substance and Sexual Activity  . Alcohol use: Yes    Alcohol/week: 0.0 oz    Comment: social/weekly  . Drug use: No  . Sexual activity: Yes

## 2017-09-30 NOTE — Telephone Encounter (Signed)
We need to submit Monovisc application  for patient.  MONOVISC INJ DR. Roda ShuttersXU LEFT KNEE

## 2017-10-05 ENCOUNTER — Telehealth (INDEPENDENT_AMBULATORY_CARE_PROVIDER_SITE_OTHER): Payer: Self-pay

## 2017-10-05 NOTE — Telephone Encounter (Signed)
Faxed PA form for SynviscOne to [facility] to [phone].

## 2017-10-10 ENCOUNTER — Telehealth (INDEPENDENT_AMBULATORY_CARE_PROVIDER_SITE_OTHER): Payer: Self-pay | Admitting: Radiology

## 2017-10-10 NOTE — Telephone Encounter (Signed)
50 mg, take 1 q 6 hours prn pain. 60, no refills.  Thank you

## 2017-10-10 NOTE — Telephone Encounter (Signed)
I spoke with patient and discussed the denial from insurance for Synvisc One.  I have filled out the appeal and he will come sign it so we can fax it in.   He is asking for something for pain.  He actually did a lot of walking in [place] over the weekend when he went back for a funeral.  He has some swelling.  Medial knee is still really painful.  Could not sleep last night.  He declines an appointment at this time, but definitely wants to have the Synvisc One if insurance will approve. Can you advise on medication for him for pain?

## 2017-10-10 NOTE — Telephone Encounter (Signed)
Marisue IvanLiz can you do this one?

## 2017-10-10 NOTE — Telephone Encounter (Signed)
How many and instructions?

## 2017-10-10 NOTE — Telephone Encounter (Signed)
Ok to write for tramadol if he would like

## 2017-10-11 ENCOUNTER — Other Ambulatory Visit (INDEPENDENT_AMBULATORY_CARE_PROVIDER_SITE_OTHER): Payer: Self-pay

## 2017-10-11 MED ORDER — TRAMADOL HCL 50 MG PO TABS: 50.0000 mg | ORAL_TABLET | Freq: Four times a day (QID) | ORAL | 0 refills | Status: DC | PRN

## 2017-10-11 NOTE — Telephone Encounter (Signed)
Called patient to see which pharmacy he uses. He is aware I called Rx into pharm

## 2017-10-23 ENCOUNTER — Other Ambulatory Visit: Payer: Self-pay | Admitting: Internal Medicine

## 2017-10-24 NOTE — Telephone Encounter (Signed)
Last refill was 09/23/17 per database. Please advise.

## 2017-10-26 ENCOUNTER — Ambulatory Visit: Payer: BLUE CROSS/BLUE SHIELD | Admitting: Nurse Practitioner

## 2017-10-28 ENCOUNTER — Telehealth: Payer: Self-pay | Admitting: Nurse Practitioner

## 2017-10-28 ENCOUNTER — Ambulatory Visit (INDEPENDENT_AMBULATORY_CARE_PROVIDER_SITE_OTHER): Payer: BLUE CROSS/BLUE SHIELD | Admitting: Nurse Practitioner

## 2017-10-28 ENCOUNTER — Encounter: Payer: Self-pay | Admitting: Nurse Practitioner

## 2017-10-28 VITALS — BP 124/92 | HR 87 | Temp 98.3°F | Resp 16 | Ht 70.0 in | Wt 210.8 lb

## 2017-10-28 DIAGNOSIS — R51 Headache: Secondary | ICD-10-CM

## 2017-10-28 DIAGNOSIS — R413 Other amnesia: Secondary | ICD-10-CM | POA: Diagnosis not present

## 2017-10-28 DIAGNOSIS — I10 Essential (primary) hypertension: Secondary | ICD-10-CM

## 2017-10-28 DIAGNOSIS — R519 Headache, unspecified: Secondary | ICD-10-CM

## 2017-10-28 DIAGNOSIS — G43909 Migraine, unspecified, not intractable, without status migrainosus: Secondary | ICD-10-CM | POA: Diagnosis not present

## 2017-10-28 MED ORDER — VERAPAMIL HCL ER 120 MG PO TBCR: 120.0000 mg | EXTENDED_RELEASE_TABLET | Freq: Every day | ORAL | 5 refills | Status: DC

## 2017-10-28 NOTE — Patient Instructions (Addendum)
No discharge paperwork was given as patient was in a rush to leave. We discussed him starting verapamil for BP control and returning in 2 weeks for follow up

## 2017-10-28 NOTE — Assessment & Plan Note (Signed)
We discussed multiple options to treat migraines including TCA, SSRI, topamax, imitrex, tramadol- he says none of these medications work for his headaches His migraines have actually improved today he says. I do not think it would be a good idea to give him vicodin due to recent memory loss and he was agreeable We discussed that his blood pressure could be contributing to his migraines and the need to get his BP under control-Will work on BP control and he will RTC in 2 weeks for follow up He was instructed to start ASA 81mg  to alleviate headaches and prevent heart attack, stroke CT head and referral to neurology today due to memory loss and to discuss treatment of migraines - CT Head Wo Contrast; Future - Ambulatory referral to Neurology

## 2017-10-28 NOTE — Telephone Encounter (Signed)
Pt referred for stat CT due to memory loss issue discovered during visit today. CT called pt to schedule and he refused to go. I called and spoke to the patients wife, states her husband has had no other memory issues, is under a lot of stress, new job, and many recent md appts. She will pay close attention to him this weekend and call us Monday if they decide to move forward with the CT. Ashleigh/PCP informed of pt decision.

## 2017-10-28 NOTE — Telephone Encounter (Signed)
Please call the patient and advise him to begin a daily 81mg  aspirin to prevent heart attack or stroke. This may help his migraines as well.

## 2017-10-28 NOTE — Assessment & Plan Note (Signed)
BP is not controlled, likely due to medication noncompliance We discussed the importance of daily medication compliance and he was instructed to start verapamil daily for BP reduction and to hopefully improve headaches RTC in 2 weeks for BP follow up - verapamil (CALAN-SR) 120 MG CR tablet; Take 1 tablet (120 mg total) by mouth at bedtime.  Dispense: 30 tablet; Refill: 5

## 2017-10-28 NOTE — Progress Notes (Signed)
Name: Cory Hoffman   MRN: [id number]    DOB: July 16, 1976   Date:10/28/2017       Progress Note  Subjective  Chief Complaint  Chief Complaint  Patient presents with  . Follow-up    blood pressure    HPI He is here today for BP and migraine follow up.  Hypertension -was started on verapamil at his last OV on 09/27/17.  He was maintained on enalapril prior but did not like it due to side effects of fatigue and sexual dysfunction. He admits he did not pick up his verapamil but has been taking the enalapril occasionally. Denies vision changes, chest pain, shortness of breath, edema.  BP Readings from Last 3 Encounters:  10/28/17 (!) 124/92  09/27/17 (!) 140/94  08/26/17 (!) 142/100   Migraines- This is a chronic problem-  He has been on many medications by headache specialist in the past He was given a prescription for zomig on his last OV on 09/27/17 but says he could not afford the prescription -it was several hundred dollars. Overall, he says his migraines have improved  He actually quit his job which was a major stressor for him and he feels like this was a big contributor to his migraines He says that vicodin is the only thing that has relieved his migraines in the past.  Memory loss-  During our office visit today, he was completely unable to recall his visit here with Dr Lawerance BachBurns on 09/27/17. He told me that he believed there may have been an error in the chart and that he thinks another patient was mistaken to be him However, he did try to fill the zomig prescribed by Dr Lawerance BachBurns, but not the verapamil. He did not remember that Dr Lawerance BachBurns was the pre scriber of this medication He was able to recall his appointments with me on 1/4 and with other providers in epic such as with Dr Roda ShuttersXu on 2/8, 1/28. He denies any syncope, weakness, tremor, seizures, speech difficulty, fevers, nausea, vomiting. He says he does not feel confused He says he is an occasional drinker but does not report any  illicit drug use When I left the room to make his plan, he called his wife who told him he was in fact here on 2/5 and saw Dr Lawerance BachBurns, and after the phone call with his wife he states he remembers the entire visit. He said he thinks he was just busy thinking about work and it slipped his mind  Patient Active Problem List   Diagnosis Date Noted  . URI (upper respiratory infection) 09/27/2017  . Unilateral primary osteoarthritis, left knee 09/19/2017  . Insomnia 08/26/2017  . Anxiety 08/26/2017  . Hypercholesteremia 08/26/2017  . Migraine without status migrainosus, not intractable 08/26/2017  . Essential hypertension 03/14/2017  . Gastroesophageal reflux disease with esophagitis 03/14/2017  . OSA (obstructive sleep apnea) 09/24/2015    Past Surgical History:  Procedure Laterality Date  . BACK SURGERY    . KNEE ARTHROSCOPY    . WISDOM TOOTH EXTRACTION      Family History  Problem Relation Age of Onset  . Heart disease Mother   . Cancer - Lung Mother   . Heart disease Father   . Stroke Paternal Grandmother   . Heart attack Paternal Grandfather        Mid 8350s    Social History   Socioeconomic History  . Marital status: Married    Spouse name: Not on file  . Number of children:  4  . Years of education: 77  . Highest education level: Not on file  Social Needs  . Financial resource strain: Not on file  . Food insecurity - worry: Not on file  . Food insecurity - inability: Not on file  . Transportation needs - medical: Not on file  . Transportation needs - non-medical: Not on file  Occupational History  . Occupation: Data processing manager  Tobacco Use  . Smoking status: Current Every Day Smoker    Packs/day: 1.00    Years: 15.00    Pack years: 15.00    Types: Cigarettes  . Smokeless tobacco: Never Used  Substance and Sexual Activity  . Alcohol use: Yes    Alcohol/week: 0.0 oz    Comment: social/weekly  . Drug use: No  . Sexual activity: Yes  Other Topics Concern  . Not  on file  Social History Narrative   Fun/Hobby: Racing, Photographer.      Current Outpatient Medications:  .  atorvastatin (LIPITOR) 40 MG tablet, Take 1 tablet (40 mg total) by mouth daily., Disp: 90 tablet, Rfl: 0 .  enalapril (VASOTEC) 5 MG tablet, Take 1 tablet (5 mg total) by mouth daily., Disp: 90 tablet, Rfl: 0 .  omeprazole (PRILOSEC) 20 MG capsule, Take 1 capsule (20 mg total) by mouth daily. Must keep scheduled appt w/new provider for refills, Disp: 90 capsule, Rfl: 0 .  propranolol ER (INDERAL LA) 80 MG 24 hr capsule, Take 1 capsule (80 mg total) by mouth daily., Disp: 90 capsule, Rfl: 0 .  traMADol (ULTRAM) 50 MG tablet, Take 1 tablet (50 mg total) by mouth every 6 (six) hours as needed., Disp: 60 tablet, Rfl: 0 .  verapamil (CALAN-SR) 120 MG CR tablet, Take 1 tablet (120 mg total) by mouth at bedtime., Disp: 30 tablet, Rfl: 5 .  zolmitriptan (ZOMIG) 5 MG tablet, Take 1 tablet (5 mg total) by mouth as needed for migraine., Disp: 10 tablet, Rfl: 0 .  zolpidem (AMBIEN) 10 MG tablet, TAKE 1 TABLET BY MOUTH AT BEDTIME AS NEEDED FOR SLEEP *NEED APPOINTMENT FOR REFILLS*, Disp: 30 tablet, Rfl: 1  No Known Allergies   ROS See HPI  Objective  Vitals:   10/28/17 1000  BP: (!) 124/92  Pulse: 87  Resp: 16  Temp: 98.3 F (36.8 C)  TempSrc: Oral  SpO2: 97%  Weight: 210 lb 12.8 oz (95.6 kg)  Height: 5\' 10"  (1.778 m)   Body mass index is 30.25 kg/m.  Physical Exam Vital signs reviewed Constitutional: Patient appears well-developed and well-nourished. No distress.  HENT: Head: Normocephalic and atraumatic. Nose: Nose normal. Mouth/Throat: Oropharynx is clear and moist.  Eyes: Conjunctivae and EOM are normal. Pupils are equal, round, and reactive to light. No scleral icterus.  Neck: Normal range of motion. Neck supple.  Cardiovascular: Normal rate, regular rhythm and normal heart sounds.   No BLE edema. Distal pulses intact. Pulmonary/Chest: Effort normal and breath sounds  normal. No respiratory distress. Musculoskeletal: Normal range of motion, no joint effusions. No gross deformities Neurological: he is alert and oriented to person, place, and time. Coordination, balance, strength, speech and gait are normal.  Skin: Skin is warm and dry. No rash noted. No erythema.  Psychiatric: Patient has a normal mood and affect. Abnormal memory.  Assessment & Plan RTC in 2 weeks for BP follow up, verapamil initiation- we can see if his headaches are better at this time but I am still placing neurology referral today   Memory loss Very  concerning loss of memory which I discussed with him today Discussed urgent CT head and referral to neurology, which he was agreeable to  He was not willing to stay for urine drug screen, states he needed to leave right away for a work meeting ?ambien side effect- will consider discontinuing pending neurology evaluation - CT Head Wo Contrast; Future - Ambulatory referral to Neurology

## 2017-11-02 ENCOUNTER — Telehealth (INDEPENDENT_AMBULATORY_CARE_PROVIDER_SITE_OTHER): Payer: Self-pay

## 2017-11-02 NOTE — Telephone Encounter (Signed)
Called and left Vm for patient to return call to schedule appt.for SynviscOne Injection with Dr. Roda ShuttersXu  Patient approved for SynviscOne, Left knee Auth.#[id number] Available 10/03/17-6-10/19

## 2017-12-21 ENCOUNTER — Other Ambulatory Visit: Payer: Self-pay | Admitting: Nurse Practitioner

## 2018-01-06 ENCOUNTER — Telehealth: Payer: Self-pay | Admitting: Family Medicine

## 2018-01-06 NOTE — Telephone Encounter (Signed)
Patient would like to transfer care from Alphonse Guild, NP at [facility] to Dr Everrett Coombe at [facility]. Ashleigh, is this okay with you? Dr Ashley Royalty, is this okay with you?

## 2018-01-06 NOTE — Telephone Encounter (Signed)
Copied from CRM [id number]. Topic: Appointment Scheduling - Scheduling Inquiry for Clinic >> Jan 06, 2018 10:51 AM Debroah Loop wrote: Reason for CRM: Patient woul dlike to switch to Dr. Everrett Coombe at [facility] from NP [facility] at [facility]. Please call back to notify If patient will be accepted at [facility].

## 2018-01-06 NOTE — Telephone Encounter (Signed)
Ok with me 

## 2018-01-08 NOTE — Telephone Encounter (Signed)
Okay with me 

## 2018-01-09 NOTE — Telephone Encounter (Signed)
Can you help schedule this patient to transfer to [facility]?

## 2018-01-19 ENCOUNTER — Ambulatory Visit (INDEPENDENT_AMBULATORY_CARE_PROVIDER_SITE_OTHER): Payer: 59 | Admitting: Family Medicine

## 2018-01-19 ENCOUNTER — Encounter: Payer: Self-pay | Admitting: Family Medicine

## 2018-01-19 VITALS — BP 110/82 | HR 72 | Ht 70.0 in | Wt 209.2 lb

## 2018-01-19 DIAGNOSIS — G4700 Insomnia, unspecified: Secondary | ICD-10-CM

## 2018-01-19 DIAGNOSIS — K2100 Gastro-esophageal reflux disease with esophagitis, without bleeding: Secondary | ICD-10-CM

## 2018-01-19 DIAGNOSIS — E7800 Pure hypercholesterolemia, unspecified: Secondary | ICD-10-CM

## 2018-01-19 DIAGNOSIS — F17210 Nicotine dependence, cigarettes, uncomplicated: Secondary | ICD-10-CM | POA: Insufficient documentation

## 2018-01-19 DIAGNOSIS — I10 Essential (primary) hypertension: Secondary | ICD-10-CM

## 2018-01-19 DIAGNOSIS — G43909 Migraine, unspecified, not intractable, without status migrainosus: Secondary | ICD-10-CM | POA: Diagnosis not present

## 2018-01-19 LAB — COMPREHENSIVE METABOLIC PANEL WITH GFR
ALT: 21 U/L (ref 0–53)
AST: 16 U/L (ref 0–37)
Albumin: 4.1 g/dL (ref 3.5–5.2)
Alkaline Phosphatase: 61 U/L (ref 39–117)
BUN: 9 mg/dL (ref 6–23)
CO2: 27 meq/L (ref 19–32)
Calcium: 9.3 mg/dL (ref 8.4–10.5)
Chloride: 107 meq/L (ref 96–112)
Creatinine, Ser: 1.02 mg/dL (ref 0.40–1.50)
GFR: 86.18 mL/min
Glucose, Bld: 115 mg/dL — ABNORMAL HIGH (ref 70–99)
Potassium: 4 meq/L (ref 3.5–5.1)
Sodium: 141 meq/L (ref 135–145)
Total Bilirubin: 0.3 mg/dL (ref 0.2–1.2)
Total Protein: 6.5 g/dL (ref 6.0–8.3)

## 2018-01-19 LAB — LIPID PANEL
Cholesterol: 171 mg/dL (ref 0–200)
HDL: 26.3 mg/dL — ABNORMAL LOW (ref >39.00)
Total CHOL/HDL Ratio: 6
Triglycerides: 474 mg/dL — ABNORMAL HIGH (ref 0.0–149.0)

## 2018-01-19 LAB — LDL CHOLESTEROL, DIRECT: Direct LDL: 73 mg/dL

## 2018-01-19 MED ORDER — ATORVASTATIN CALCIUM 40 MG PO TABS: 40.0000 mg | ORAL_TABLET | Freq: Every day | ORAL | 0 refills | Status: DC

## 2018-01-19 MED ORDER — ZOLPIDEM TARTRATE 10 MG PO TABS: ORAL_TABLET | ORAL | 5 refills | Status: DC

## 2018-01-19 MED ORDER — VARENICLINE TARTRATE 0.5 MG X 11 & 1 MG X 42 PO MISC: ORAL | 0 refills | Status: DC

## 2018-01-19 MED ORDER — OMEPRAZOLE 20 MG PO CPDR: 20.0000 mg | DELAYED_RELEASE_CAPSULE | Freq: Every day | ORAL | 0 refills | Status: DC

## 2018-01-19 NOTE — Assessment & Plan Note (Signed)
BP is well controlled without medication, will not re-order as this time Recommend that he check occasionally at home Follow low salt diet with regular exercise

## 2018-01-19 NOTE — Assessment & Plan Note (Signed)
Well controlled with omeprazole, renewed.

## 2018-01-19 NOTE — Assessment & Plan Note (Signed)
Doing well with atorvastatin Update LFT's and cholesterol panel today.

## 2018-01-19 NOTE — Assessment & Plan Note (Signed)
>  10 minutes spent counseling on smoking cessation Advise quitting He is willing to try quitting again, good success with chantix previously and would like to start again Given handout regarding smoking cessation Follow up in  6 months or sooner if needed.

## 2018-01-19 NOTE — Assessment & Plan Note (Signed)
Managing well with Remus Loffler Denies side effects at this time, refilled.

## 2018-01-19 NOTE — Assessment & Plan Note (Signed)
Significant reduction in frequency since job switch He will continue zomig as needed.

## 2018-01-19 NOTE — Progress Notes (Signed)
Cory Hoffman - 42 y.o. male MRN [id number]  Date of birth: 31-Oct-1975  Subjective Chief Complaint  Patient presents with  . Medication Refill    HPI Cory Hoffman is a 42 y.o. male with a history of migraines, HTN, insomnia and hyperlipidemia here today to establish with new pcp.  -HTN:  History of HTN, prior treatment with enalapril, verapamil and propranolol.  He has discontinued taking these medications and blood pressure is well controlled.  He reports that he has went from a very high stress job to a more relaxed job and that has made all the difference.   Unfortunately he does continue to smoke about 1 ppd but is interested in quitting.  He has been able to quit in the past with chantix and would like to try this again.  He denies chest pain, shortness of breath, palpitations, vision changes or edema.   -Hyperlipidemia:  Treated with atorvastatin, tolerating well and denies side effects.  Has been working on reduction of high fat foods.  -Insomnia:  Treated with ambien each night.  States that he cannot sleep without Remus Loffler, will just lay there all night.  Denies significant side effects from this medication.  Previous notes indicate possible memory issue that could have been related to [place] use.  Referred to neuro and imaging of the head however he declined both of these.  Has not noticed any neurological changes or other memory issues.    -Migraines:  Reports migraines have decreased in frequency since his change in jobs.  Will still take zomig occasionally but overall is doing well.  Zomig continues to work well when he does have a migraine without significant side effects.    -GERD:  Well managed with omeprazole.  Denies abdominal pain, nausea or melena.   ROS: ROS completed and negative except as noted per HPI No Known Allergies  Past Medical History:  Diagnosis Date  . Chronic headaches   . Frequent headaches   . HTN (hypertension)   . Hyperlipidemia   . Migraines       Past Surgical History:  Procedure Laterality Date  . BACK SURGERY    . KNEE ARTHROSCOPY    . WISDOM TOOTH EXTRACTION      Social History   Socioeconomic History  . Marital status: Married    Spouse name: Not on file  . Number of children: 4  . Years of education: 14  . Highest education level: Not on file  Occupational History  . Occupation: Data processing manager  Social Needs  . Financial resource strain: Not on file  . Food insecurity:    Worry: Not on file    Inability: Not on file  . Transportation needs:    Medical: Not on file    Non-medical: Not on file  Tobacco Use  . Smoking status: Current Every Day Smoker    Packs/day: 1.00    Years: 15.00    Pack years: 15.00    Types: Cigarettes  . Smokeless tobacco: Never Used  Substance and Sexual Activity  . Alcohol use: Yes    Alcohol/week: 0.0 oz    Comment: social/weekly  . Drug use: No  . Sexual activity: Yes  Lifestyle  . Physical activity:    Days per week: Not on file    Minutes per session: Not on file  . Stress: Not on file  Relationships  . Social connections:    Talks on phone: Not on file    Gets together: Not on  file    Attends religious service: Not on file    Active member of club or organization: Not on file    Attends meetings of clubs or organizations: Not on file    Relationship status: Not on file  Other Topics Concern  . Not on file  Social History Narrative   Fun/Hobby: Racing, Photographer.     Family History  Problem Relation Age of Onset  . Heart disease Mother   . Cancer - Lung Mother   . Heart disease Father   . Stroke Paternal Grandmother   . Heart attack Paternal Grandfather        Mid 42s    Health Maintenance  Topic Date Due  . INFLUENZA VACCINE  04/28/2018 (Originally 03/23/2018)  . TETANUS/TDAP  08/26/2018 (Originally 07/12/1997)  . HIV Screening  01/20/2019 (Originally 07/12/1993)     ----------------------------------------------------------------------------------------------------------------------------------------------------------------------------------------------------------------- Physical Exam BP 110/82 (BP Location: Right Arm, Patient Position: Sitting, Cuff Size: Normal)   Pulse 72   Ht  (1.778 m)   Wt 209 lb 3.2 oz (94.9 kg)   BMI 30.02 kg/m   Physical Exam  Constitutional: He is oriented to person, place, and time. He appears well-nourished. No distress.  HENT:  Head: Normocephalic and atraumatic.  Mouth/Throat: Oropharynx is clear and moist.  Eyes: Conjunctivae are normal. No scleral icterus.  Neck: Neck supple. No thyromegaly present.  Cardiovascular: Normal rate, regular rhythm and normal heart sounds.  Pulmonary/Chest: Effort normal and breath sounds normal.  Musculoskeletal: He exhibits no edema.  Lymphadenopathy:    He has no cervical adenopathy.  Neurological: He is alert and oriented to person, place, and time.  Skin: Skin is warm and dry. No rash noted.  Psychiatric: He has a normal mood and affect. His behavior is normal.    ------------------------------------------------------------------------------------------------------------------------------------------------------------------------------------------------------------------- Assessment and Plan  Essential hypertension BP is well controlled without medication, will not re-order as this time Recommend that he check occasionally at home Follow low salt diet with regular exercise   Migraine without status migrainosus, not intractable Significant reduction in frequency since job switch He will continue zomig as needed.   Gastroesophageal reflux disease with esophagitis Well controlled with omeprazole, renewed.   Insomnia Managing well with Remus Loffler Denies side effects at this time, refilled.    Hypercholesteremia Doing well with atorvastatin Update LFT's and  cholesterol panel today.   Nicotine dependence, cigarettes, uncomplicated >10 minutes spent counseling on smoking cessation Advise quitting He is willing to try quitting again, good success with chantix previously and would like to start again Given handout regarding smoking cessation Follow up in  6 months or sooner if needed.

## 2018-01-19 NOTE — Patient Instructions (Signed)
It was very nice to meet you today! Continue current medications Work on quitting smoking.  Set a quit date and stick to the quit date you choose.   Steps to Quit Smoking Smoking tobacco can be bad for your health. It can also affect almost every organ in your body. Smoking puts you and people around you at risk for many serious long-lasting (chronic) diseases. Quitting smoking is hard, but it is one of the best things that you can do for your health. It is never too late to quit. What are the benefits of quitting smoking? When you quit smoking, you lower your risk for getting serious diseases and conditions. They can include:  Lung cancer or lung disease.  Heart disease.  Stroke.  Heart attack.  Not being able to have children (infertility).  Weak bones (osteoporosis) and broken bones (fractures).  If you have coughing, wheezing, and shortness of breath, those symptoms may get better when you quit. You may also get sick less often. If you are pregnant, quitting smoking can help to lower your chances of having a baby of low birth weight. What can I do to help me quit smoking? Talk with your doctor about what can help you quit smoking. Some things you can do (strategies) include:  Quitting smoking totally, instead of slowly cutting back how much you smoke over a period of time.  Going to in-person counseling. You are more likely to quit if you go to many counseling sessions.  Using resources and support systems, such as: ? Agricultural engineer with a Veterinary surgeon. ? Phone quitlines. ? Automotive engineer. ? Support groups or group counseling. ? Text messaging programs. ? Mobile phone apps or applications.  Taking medicines. Some of these medicines may have nicotine in them. If you are pregnant or breastfeeding, do not take any medicines to quit smoking unless your doctor says it is okay. Talk with your doctor about counseling or other things that can help you.  Talk with your  doctor about using more than one strategy at the same time, such as taking medicines while you are also going to in-person counseling. This can help make quitting easier. What things can I do to make it easier to quit? Quitting smoking might feel very hard at first, but there is a lot that you can do to make it easier. Take these steps:  Talk to your family and friends. Ask them to support and encourage you.  Call phone quitlines, reach out to support groups, or work with a Veterinary surgeon.  Ask people who smoke to not smoke around you.  Avoid places that make you want (trigger) to smoke, such as: ? Bars. ? Parties. ? Smoke-break areas at work.  Spend time with people who do not smoke.  Lower the stress in your life. Stress can make you want to smoke. Try these things to help your stress: ? Getting regular exercise. ? Deep-breathing exercises. ? Yoga. ? Meditating. ? Doing a body scan. To do this, close your eyes, focus on one area of your body at a time from head to toe, and notice which parts of your body are tense. Try to relax the muscles in those areas.  Download or buy apps on your mobile phone or tablet that can help you stick to your quit plan. There are many free apps, such as QuitGuide from the [employer] developer for Disease Control and Prevention). You can find more support from smokefree.gov and other websites.  This information is  not intended to replace advice given to you by your health care provider. Make sure you discuss any questions you have with your health care provider. Document Released: 06/05/2009 Document Revised: 04/06/2016 Document Reviewed: 12/24/2014 Elsevier Interactive Patient Education  2018 [employer].

## 2018-01-23 NOTE — Progress Notes (Signed)
Glucose and TG are elevated.  He should follow a low fat/low carb diet with regular exercise to improve this.  I would recommend he continue on atorvastatin as well.

## 2018-02-02 ENCOUNTER — Telehealth: Payer: Self-pay | Admitting: Family Medicine

## 2018-02-02 NOTE — Telephone Encounter (Signed)
PA for Chantix start month pak is approve by insurance. Notified pharmacy and they have it ready for the pt.   File ID: [id number]

## 2018-02-22 ENCOUNTER — Encounter (INDEPENDENT_AMBULATORY_CARE_PROVIDER_SITE_OTHER): Payer: Self-pay | Admitting: Orthopaedic Surgery

## 2018-02-22 ENCOUNTER — Ambulatory Visit (INDEPENDENT_AMBULATORY_CARE_PROVIDER_SITE_OTHER): Payer: 59 | Admitting: Orthopaedic Surgery

## 2018-02-22 DIAGNOSIS — M25562 Pain in left knee: Secondary | ICD-10-CM | POA: Diagnosis not present

## 2018-02-22 DIAGNOSIS — G8929 Other chronic pain: Secondary | ICD-10-CM

## 2018-02-22 MED ORDER — HYDROCODONE-ACETAMINOPHEN 5-325 MG PO TABS: 1.0000 | ORAL_TABLET | Freq: Every day | ORAL | 0 refills | Status: DC | PRN

## 2018-02-22 NOTE — Progress Notes (Deleted)
   Office Visit Note   Patient: Cory Hoffman           Date of Birth: Dec 07, 1975           MRN: [id number] Visit Date: 02/22/2018              Requested by: Everrett CoombeMatthews, Cody, DO [address] [place], [place] [address] PCP: Everrett CoombeMatthews, Cody, DO   Assessment & Plan: Visit Diagnoses: No diagnosis found.  Plan: ***  Follow-Up Instructions: No follow-ups on file.   Orders:  Orders Placed This Encounter  Procedures  . Large Joint Inj   No orders of the defined types were placed in this encounter.     Procedures: Large Joint Inj: L knee on 02/22/2018 4:29 PM Indications: pain Details: 22 G needle, anterolateral approach Medications: 2 mL lidocaine 1 %; 2 mL bupivacaine 0.25 %; 40 mg methylPREDNISolone acetate 40 MG/ML      Clinical Data: No additional findings.   Subjective: No chief complaint on file.   HPI  Review of Systems   Objective: Vital Signs: There were no vitals taken for this visit.  Physical Exam  Ortho Exam  Specialty Comments:  No specialty comments available.  Imaging: No results found.   PMFS History: Patient Active Problem List   Diagnosis Date Noted  . Nicotine dependence, cigarettes, uncomplicated 01/19/2018  . Unilateral primary osteoarthritis, left knee 09/19/2017  . Insomnia 08/26/2017  . Anxiety 08/26/2017  . Hypercholesteremia 08/26/2017  . Migraine without status migrainosus, not intractable 08/26/2017  . Essential hypertension 03/14/2017  . Gastroesophageal reflux disease with esophagitis 03/14/2017  . OSA (obstructive sleep apnea) 09/24/2015   Past Medical History:  Diagnosis Date  . Chronic headaches   . Frequent headaches   . HTN (hypertension)   . Hyperlipidemia   . Migraines     Family History  Problem Relation Age of Onset  . Heart disease Mother   . Cancer - Lung Mother   . Heart disease Father   . Stroke Paternal Grandmother   . Heart attack Paternal Grandfather        Mid 7850s    Past  Surgical History:  Procedure Laterality Date  . BACK SURGERY    . KNEE ARTHROSCOPY    . WISDOM TOOTH EXTRACTION     Social History   Occupational History  . Occupation: Data processing managerBranch manager  Tobacco Use  . Smoking status: Current Every Day Smoker    Packs/day: 1.00    Years: 15.00    Pack years: 15.00    Types: Cigarettes  . Smokeless tobacco: Never Used  Substance and Sexual Activity  . Alcohol use: Yes    Alcohol/week: 0.0 oz    Comment: social/weekly  . Drug use: No  . Sexual activity: Yes

## 2018-02-22 NOTE — Progress Notes (Signed)
Office Visit Note   Patient: Cory Hoffman           Date of Birth: 12/14/1975           MRN: [id number] Visit Date: 02/22/2018              Requested by: Everrett CoombeMatthews, Cody, DO [address] [place], [place] [address] PCP: Everrett CoombeMatthews, Cody, DO   Assessment & Plan: Visit Diagnoses:  1. Chronic pain of left knee     Plan: Impression is left knee medial meniscus tear.  We will get an mri to further assess this.  He will follow up after.  Follow-Up Instructions: Return in about 10 days (around 03/04/2018).   Orders:  No orders of the defined types were placed in this encounter.  Meds ordered this encounter  Medications  . HYDROcodone-acetaminophen (NORCO) 5-325 MG tablet    Sig: Take 1 tablet by mouth daily as needed for moderate pain.    Dispense:  7 tablet    Refill:  0      Procedures: No procedures performed   Clinical Data: No additional findings.   Subjective: No chief complaint on file.   HPI patient is a pleasant 42 year old gentleman who presents to our clinic today with recurrent left knee pain.  This initially began over 10 years ago following a left knee arthroscopic meniscectomy by physician in [place].  He never had relief of pain following the surgery.  The pain he has is medial aspect.  We saw him for this back in January 2019.  MRI was ordered which showed nothing more than partial thickness fissure in the cartilage to the patellar apex.  We then proceeded with a cortisone injection in February of this year.  This significantly helped for about a month and a half.  Yesterday, he was bending down to pick up a piece of trash when all of a sudden he had a terrible pop and pain to the medial aspect of his knee.  Since then he has had marked pain with extension and flexion.  Review of Systems as detailed in HPI.  All others reviewed and are negative.   Objective: Vital Signs: There were no vitals taken for this visit.  Physical Exam well-developed  well-nourished gentleman in no acute distress.  Alert and oriented x3.  Ortho Exam examination of the left knee shows a trace effusion.  No patellofemoral crepitus.  Range of motion 3 to 95 degrees.  Moderate tenderness medial joint line.  He is stable to valgus and varus stress.  He is neurovascularly intact distally.  Specialty Comments:  No specialty comments available.  Imaging: No new imaging   PMFS History: Patient Active Problem List   Diagnosis Date Noted  . Chronic pain of left knee 02/22/2018  . Nicotine dependence, cigarettes, uncomplicated 01/19/2018  . Unilateral primary osteoarthritis, left knee 09/19/2017  . Insomnia 08/26/2017  . Anxiety 08/26/2017  . Hypercholesteremia 08/26/2017  . Migraine without status migrainosus, not intractable 08/26/2017  . Essential hypertension 03/14/2017  . Gastroesophageal reflux disease with esophagitis 03/14/2017  . OSA (obstructive sleep apnea) 09/24/2015   Past Medical History:  Diagnosis Date  . Chronic headaches   . Frequent headaches   . HTN (hypertension)   . Hyperlipidemia   . Migraines     Family History  Problem Relation Age of Onset  . Heart disease Mother   . Cancer - Lung Mother   . Heart disease Father   . Stroke Paternal Grandmother   .  Heart attack Paternal Grandfather        Mid 41s    Past Surgical History:  Procedure Laterality Date  . BACK SURGERY    . KNEE ARTHROSCOPY    . WISDOM TOOTH EXTRACTION     Social History   Occupational History  . Occupation: Data processing manager  Tobacco Use  . Smoking status: Current Every Day Smoker    Packs/day: 1.00    Years: 15.00    Pack years: 15.00    Types: Cigarettes  . Smokeless tobacco: Never Used  Substance and Sexual Activity  . Alcohol use: Yes    Alcohol/week: 0.0 oz    Comment: social/weekly  . Drug use: No  . Sexual activity: Yes

## 2018-02-28 ENCOUNTER — Ambulatory Visit (INDEPENDENT_AMBULATORY_CARE_PROVIDER_SITE_OTHER): Payer: BLUE CROSS/BLUE SHIELD | Admitting: Orthopaedic Surgery

## 2018-03-10 ENCOUNTER — Ambulatory Visit: Payer: 59

## 2018-03-10 ENCOUNTER — Ambulatory Visit: Payer: Self-pay | Admitting: *Deleted

## 2018-03-10 ENCOUNTER — Ambulatory Visit (INDEPENDENT_AMBULATORY_CARE_PROVIDER_SITE_OTHER): Payer: 59 | Admitting: Family Medicine

## 2018-03-10 ENCOUNTER — Encounter: Payer: Self-pay | Admitting: Family Medicine

## 2018-03-10 ENCOUNTER — Telehealth: Payer: Self-pay | Admitting: Family Medicine

## 2018-03-10 VITALS — BP 130/80 | HR 72 | Temp 97.6°F | Ht 70.0 in | Wt 209.0 lb

## 2018-03-10 DIAGNOSIS — R1013 Epigastric pain: Secondary | ICD-10-CM | POA: Diagnosis not present

## 2018-03-10 DIAGNOSIS — M1710 Unilateral primary osteoarthritis, unspecified knee: Secondary | ICD-10-CM | POA: Insufficient documentation

## 2018-03-10 LAB — COMPREHENSIVE METABOLIC PANEL
ALK PHOS: 73 U/L (ref 39–117)
ALT: 28 U/L (ref 0–53)
AST: 15 U/L (ref 0–37)
Albumin: 4 g/dL (ref 3.5–5.2)
BUN: 8 mg/dL (ref 6–23)
CO2: 30 mEq/L (ref 19–32)
Calcium: 9 mg/dL (ref 8.4–10.5)
Chloride: 107 mEq/L (ref 96–112)
Creatinine, Ser: 1.07 mg/dL (ref 0.40–1.50)
GFR: 81.5 mL/min (ref >60.00)
GLUCOSE: 116 mg/dL — AB (ref 70–99)
Potassium: 4.3 mEq/L (ref 3.5–5.1)
Sodium: 142 mEq/L (ref 135–145)
TOTAL PROTEIN: 6.6 g/dL (ref 6.0–8.3)
Total Bilirubin: 0.4 mg/dL (ref 0.2–1.2)

## 2018-03-10 LAB — CBC
HCT: 45.9 % (ref 39.0–52.0)
Hemoglobin: 15.6 g/dL (ref 13.0–17.0)
MCHC: 34 g/dL (ref 30.0–36.0)
MCV: 86.6 fl (ref 78.0–100.0)
PLATELETS: 260 10*3/uL (ref 150.0–400.0)
RBC: 5.3 Mil/uL (ref 4.22–5.81)
RDW: 13.2 % (ref 11.5–15.5)
WBC: 9.2 10*3/uL (ref 4.0–10.5)

## 2018-03-10 LAB — LIPASE: Lipase: 48 U/L (ref 11.0–59.0)

## 2018-03-10 MED ORDER — PANTOPRAZOLE SODIUM 40 MG PO TBEC: 40.0000 mg | DELAYED_RELEASE_TABLET | Freq: Every day | ORAL | 3 refills | Status: DC

## 2018-03-10 MED ORDER — SUCRALFATE 1 G PO TABS: 1.0000 g | ORAL_TABLET | Freq: Three times a day (TID) | ORAL | 1 refills | Status: DC

## 2018-03-10 NOTE — Patient Instructions (Signed)
We'll call you this afternoon with lab results Continue pushing fluids

## 2018-03-10 NOTE — Telephone Encounter (Signed)
See triage note.

## 2018-03-10 NOTE — Progress Notes (Addendum)
[name] - 42 y.o. male MRN [id number]  Date of birth: [birth year]  Subjective Chief Complaint  Patient presents with  . Abdominal Pain    3 days stomach has been in pain. Last 2 nights has woken up in severe pain. After eating the pain becomes worse.has tried OTC with no relief    HPI Cory Hoffman is a 42 y.o. male here today with complain of abdominal pain.  Pain began 3 days ago and is located in central abdomen/epigastric area.  Pain is worsened by eating or laying down flat.  Pain is not too bad if not eating.   He has had some reflux symptoms as well, tried tums without relief.  He has had some associated nausea without vomiting and some occasional diarrhea when pain is flared up.  He denies blood in his stool, fever, chills, chest pain, nausea.    ROS:  A comprehensive ROS was completed and negative except as noted per HPI No Known Allergies  Past Medical History:  Diagnosis Date  . Chronic headaches   . Frequent headaches   . HTN (hypertension)   . Hyperlipidemia   . Migraines     Past Surgical History:  Procedure Laterality Date  . BACK SURGERY    . KNEE ARTHROSCOPY    . WISDOM TOOTH EXTRACTION      Social History   Socioeconomic History  . Marital status: Married    Spouse name: Not on file  . Number of children: 4  . Years of education: 77  . Highest education level: Not on file  Occupational History  . Occupation: Air cabin crew  Social Needs  . Financial resource strain: Not on file  . Food insecurity:    Worry: Not on file    Inability: Not on file  . Transportation needs:    Medical: Not on file    Non-medical: Not on file  Tobacco Use  . Smoking status: Current Every Day Smoker    Packs/day: 1.00    Years: 15.00    Pack years: 15.00    Types: Cigarettes  . Smokeless tobacco: Never Used  Substance and Sexual Activity  . Alcohol use: Yes    Alcohol/week: 0.0 oz    Comment: social/weekly  . Drug use: No  . Sexual activity: Yes    Lifestyle  . Physical activity:    Days per week: Not on file    Minutes per session: Not on file  . Stress: Not on file  Relationships  . Social connections:    Talks on phone: Not on file    Gets together: Not on file    Attends religious service: Not on file    Active member of club or organization: Not on file    Attends meetings of clubs or organizations: Not on file    Relationship status: Not on file  Other Topics Concern  . Not on file  Social History Narrative   Fun/Hobby: Racing, Designer, fashion/clothing.     Family History  Problem Relation Age of Onset  . Heart disease Mother   . Cancer - Lung Mother   . Heart disease Father   . Stroke Paternal Grandmother   . Heart attack Paternal Grandfather        Mid 31s    Health Maintenance  Topic Date Due  . INFLUENZA VACCINE  04/28/2018 (Originally 03/23/2018)  . TETANUS/TDAP  08/26/2018 (Originally 07/12/1997)  . HIV Screening  01/20/2019 (Originally 07/12/1993)    ----------------------------------------------------------------------------------------------------------------------------------------------------------------------------------------------------------------- Physical  Exam BP 130/80 (BP Location: Left Arm, Patient Position: Sitting, Cuff Size: Normal)   Pulse 72   Temp 97.6 F (36.4 C) (Oral)   Ht _0  (1.778 m)   Wt 209 lb (94.8 kg)   SpO2 93%   BMI 29.99 kg/m   Physical Exam  Constitutional: He is oriented to person, place, and time. He appears well-nourished. No distress.  HENT:  Head: Normocephalic.  Mouth/Throat: Oropharynx is clear and moist.  Eyes: No scleral icterus.  Neck: Neck supple. No thyromegaly present.  Cardiovascular: Normal rate, regular rhythm, normal heart sounds and intact distal pulses.  Pulmonary/Chest: Effort normal and breath sounds normal.  Abdominal: Soft. Bowel sounds are normal. He exhibits no distension. There is tenderness (epigastric and RUQ ttp).  Musculoskeletal: He  exhibits no edema.  Lymphadenopathy:    He has no cervical adenopathy.  Neurological: He is alert and oriented to person, place, and time. No cranial nerve deficit. He exhibits normal muscle tone.  Skin: Skin is warm and dry. No rash noted.  Psychiatric: He has a normal mood and affect. His behavior is normal.    ------------------------------------------------------------------------------------------------------------------------------------------------------------------------------------------------------------------- Assessment and Plan  Epigastric pain -This is a new problem requiring further work-up -Differential includes acute pancreatitis, gallbladder disease, gastritis/peptic ulcer.   -Lipase, CMP and CBC ordered  Addendum:   Labs returned with normal lipase and no elevation in LFT's/Alk phos.  Will send in rx for protonix and carafate.  Instructed that if he has significant worsening, blood in stool/dark stool he should be seen in ED.   If not improving with this will proceed with abdominal ultrasound.

## 2018-03-10 NOTE — Telephone Encounter (Signed)
Pt's wife called (husband at work) stating that he has had abd pain and diarrhea for 3 days. [place] The wife wanted to make him an appointment. I told her that we would have to speak with the patient. She stated that she would call him, and have him call the office back. She said that she would also advise him to go to an Urgent Care. Per wife, pt has refused to go to an ED. [facility]  [place] [facility].

## 2018-03-10 NOTE — Assessment & Plan Note (Addendum)
-  This is a new problem requiring further work-up -Differential includes acute pancreatitis, gallbladder disease, gastritis/peptic ulcer.   -Lipase, CMP and CBC ordered  Addendum:   Labs returned with normal lipase and no elevation in LFT's/Alk phos.  Will send in rx for protonix and carafate.  Instructed that if he has significant worsening, blood in stool/dark stool he should be seen in ED.   If not improving with this will proceed with abdominal ultrasound.

## 2018-03-10 NOTE — Telephone Encounter (Signed)
Patient is calling to report he has been having severe abdominal pain made worse by eating. Appointment for evaluation .  Reason for Disposition . [1] MILD-MODERATE pain AND [2] constant AND [3] present > 2 hours  Answer Assessment - Initial Assessment Questions 1. LOCATION: "Where does it hurt?"      Upper abdomen 2. RADIATION: "Does the pain shoot anywhere else?" (e.g., chest, back)     Left and right of centerline  3. ONSET: "When did the pain begin?" (Minutes, hours or days ago)      3 days ago- worse after eats 4. SUDDEN: "Gradual or sudden onset?"     Sudden in the middle of the night 5. PATTERN "Does the pain come and go, or is it constant?"    - If constant: "Is it getting better, staying the same, or worsening?"      (Note: Constant means the pain never goes away completely; most serious pain is constant and it progresses)     - If intermittent: "How long does it last?" "Do you have pain now?"     (Note: Intermittent means the pain goes away completely between bouts)     Soreness is constant- pain comes and goes 6. SEVERITY: "How bad is the pain?"  (e.g., Scale 1-10; mild, moderate, or severe)    - MILD (1-3): doesn't interfere with normal activities, abdomen soft and not tender to touch     - MODERATE (4-7): interferes with normal activities or awakens from sleep, tender to touch     - SEVERE (8-10): excruciating pain, doubled over, unable to do any normal activities       Eating makes it worse- 10, soreness- 7 7. RECURRENT SYMPTOM: "Have you ever had this type of abdominal pain before?" If so, ask: "When was the last time?" and "What happened that time?"      no 8. CAUSE: "What do you think is causing the abdominal pain?"     Unknown 9. RELIEVING/AGGRAVATING FACTORS: "What makes it better or worse?" (e.g., movement, antacids, bowel movement)     OTC- not helping, diarrhea after eating and pain 10. OTHER SYMPTOMS: "Has there been any vomiting, diarrhea, constipation, or urine  problems?"       Diarrhea after eating- eating bland diet.  Protocols used: ABDOMINAL PAIN - UPPER-A-AH, ABDOMINAL PAIN - MALE-A-AH

## 2018-03-12 ENCOUNTER — Ambulatory Visit
Admission: RE | Admit: 2018-03-12 | Discharge: 2018-03-12 | Disposition: A | Discharge status: Home / Self Care | Payer: 59 | Source: Ambulatory Visit | Attending: Physician Assistant | Admitting: Physician Assistant

## 2018-03-12 DIAGNOSIS — M25562 Pain in left knee: Principal | ICD-10-CM

## 2018-03-12 DIAGNOSIS — G8929 Other chronic pain: Secondary | ICD-10-CM

## 2018-03-12 DIAGNOSIS — M25462 Effusion, left knee: Secondary | ICD-10-CM | POA: Diagnosis not present

## 2018-03-13 NOTE — Progress Notes (Signed)
Fu appt to discuss mri

## 2018-03-14 ENCOUNTER — Telehealth (INDEPENDENT_AMBULATORY_CARE_PROVIDER_SITE_OTHER): Payer: Self-pay

## 2018-03-14 ENCOUNTER — Ambulatory Visit (INDEPENDENT_AMBULATORY_CARE_PROVIDER_SITE_OTHER): Payer: 59 | Admitting: Orthopaedic Surgery

## 2018-03-14 DIAGNOSIS — G8929 Other chronic pain: Secondary | ICD-10-CM | POA: Diagnosis not present

## 2018-03-14 DIAGNOSIS — M25562 Pain in left knee: Secondary | ICD-10-CM

## 2018-03-14 NOTE — Progress Notes (Signed)
     Patient: Cory Hoffman           Date of Birth: 1975-09-14           MRN: [id number] Visit Date: 03/14/2018 PCP: Everrett CoombeMatthews, Cody, DO   Assessment & Plan:  Chief Complaint:  Chief Complaint  Patient presents with  . Left Knee - Pain   Visit Diagnoses:  1. Chronic pain of left knee     Plan: Patient comes in today to discuss MRI results of his left knee.  MRI results from 03/12/2018 show mild chondromalacia patella.  No other structural abnormalities.  The patient has had 2 previous cortisone injections with moderate relief of symptoms but these were not long-lasting.  His pain has dramatically improved, but is still quite bothersome at times.  His range of motion to the left knee has dramatically improved as well.  Today, we discussed repeating cortisone injection versus getting approval for Visco supplementation injection.  He would rather proceed with Visco supplementation injection.  We will send for approval for this.  He will follow-up with us once approved.  Call with concerns or questions in the meantime.  Follow-Up Instructions: Return for once approved for left knee visco injection.   Orders:  No orders of the defined types were placed in this encounter.  No orders of the defined types were placed in this encounter.   Imaging: No results found.  PMFS History: Patient Active Problem List   Diagnosis Date Noted  . Arthritis of knee 03/10/2018  . Epigastric pain 03/10/2018  . Chronic pain of left knee 02/22/2018  . Nicotine dependence, cigarettes, uncomplicated 01/19/2018  . Unilateral primary osteoarthritis, left knee 09/19/2017  . Insomnia 08/26/2017  . Anxiety 08/26/2017  . Hypercholesteremia 08/26/2017  . Migraine without status migrainosus, not intractable 08/26/2017  . Essential hypertension 03/14/2017  . Gastroesophageal reflux disease with esophagitis 03/14/2017  . Encounter for smoking cessation counseling 04/07/2016  . OSA (obstructive sleep apnea)  09/24/2015  . Prediabetes 05/26/2015  . Vitamin D deficiency 05/26/2015  . Gastroesophageal reflux disease without esophagitis 05/11/2015  . Obesity (BMI 30-39.9) 05/11/2015  . Family history of premature CAD 04/07/2015  . Epicondylitis, medial humeral 08/27/2013   Past Medical History:  Diagnosis Date  . Chronic headaches   . Frequent headaches   . HTN (hypertension)   . Hyperlipidemia   . Migraines     Family History  Problem Relation Age of Onset  . Heart disease Mother   . Cancer - Lung Mother   . Heart disease Father   . Stroke Paternal Grandmother   . Heart attack Paternal Grandfather        Mid 7450s    Past Surgical History:  Procedure Laterality Date  . BACK SURGERY    . KNEE ARTHROSCOPY    . WISDOM TOOTH EXTRACTION     Social History   Occupational History  . Occupation: Data processing managerBranch manager  Tobacco Use  . Smoking status: Current Every Day Smoker    Packs/day: 1.00    Years: 15.00    Pack years: 15.00    Types: Cigarettes  . Smokeless tobacco: Never Used  Substance and Sexual Activity  . Alcohol use: Yes    Alcohol/week: 0.0 oz    Comment: social/weekly  . Drug use: No  . Sexual activity: Yes

## 2018-03-14 NOTE — Telephone Encounter (Signed)
Please submit for Left knee gel injection. Thank you. (Dr Roda ShuttersXU)

## 2018-03-20 ENCOUNTER — Telehealth: Payer: Self-pay

## 2018-03-20 DIAGNOSIS — R1011 Right upper quadrant pain: Secondary | ICD-10-CM

## 2018-03-20 NOTE — Telephone Encounter (Signed)
US ordered

## 2018-03-20 NOTE — Telephone Encounter (Signed)
Copied from CRM [id number]. Topic: Referral - Request >> Mar 20, 2018  7:59 AM Leafy Roobinson, Norma J wrote: Reason for CRM:pt wife is calling and patient would like to proceed with abd ultrasound. Pt was seen on 03/10/18. Pt is not worst.

## 2018-03-29 ENCOUNTER — Telehealth (INDEPENDENT_AMBULATORY_CARE_PROVIDER_SITE_OTHER): Payer: Self-pay

## 2018-03-29 NOTE — Telephone Encounter (Signed)
Submitted for Monovisc, left knee. 

## 2018-04-03 ENCOUNTER — Telehealth (INDEPENDENT_AMBULATORY_CARE_PROVIDER_SITE_OTHER): Payer: Self-pay

## 2018-04-03 NOTE — Telephone Encounter (Signed)
Received VOB for Monovisc, left knee. Submitted Rx form through [facility] portal for medication to be obtained through pharmacy under medical benefits. Faxed Rx for Monovisc, left knee to [phone]

## 2018-04-10 ENCOUNTER — Telehealth (INDEPENDENT_AMBULATORY_CARE_PROVIDER_SITE_OTHER): Payer: Self-pay

## 2018-04-10 NOTE — Telephone Encounter (Signed)
Faxed current allergy list to [facility] at [phone]

## 2018-04-11 ENCOUNTER — Telehealth (INDEPENDENT_AMBULATORY_CARE_PROVIDER_SITE_OTHER): Payer: Self-pay

## 2018-04-11 NOTE — Telephone Encounter (Signed)
Completed and faxed [facility] enrollment form to [phone].

## 2018-05-29 DIAGNOSIS — B078 Other viral warts: Secondary | ICD-10-CM | POA: Diagnosis not present

## 2018-06-01 ENCOUNTER — Other Ambulatory Visit: Payer: Self-pay | Admitting: Family Medicine

## 2018-06-20 ENCOUNTER — Encounter: Payer: Self-pay | Admitting: Family Medicine

## 2018-06-20 ENCOUNTER — Ambulatory Visit (INDEPENDENT_AMBULATORY_CARE_PROVIDER_SITE_OTHER): Payer: 59 | Admitting: Family Medicine

## 2018-06-20 VITALS — BP 126/80 | HR 73 | Temp 98.2°F | Ht 70.0 in | Wt 206.4 lb

## 2018-06-20 DIAGNOSIS — J040 Acute laryngitis: Secondary | ICD-10-CM

## 2018-06-20 DIAGNOSIS — H6983 Other specified disorders of Eustachian tube, bilateral: Secondary | ICD-10-CM

## 2018-06-20 DIAGNOSIS — B349 Viral infection, unspecified: Secondary | ICD-10-CM

## 2018-06-20 MED ORDER — BENZONATATE 100 MG PO CAPS: 100.0000 mg | ORAL_CAPSULE | Freq: Two times a day (BID) | ORAL | 0 refills | Status: DC | PRN

## 2018-06-20 MED ORDER — PREDNISONE 20 MG PO TABS: 20.0000 mg | ORAL_TABLET | Freq: Two times a day (BID) | ORAL | 0 refills | Status: AC

## 2018-06-20 NOTE — Patient Instructions (Signed)
Eustachian Tube Dysfunction The eustachian tube connects the middle ear to the back of the nose. It regulates air pressure in the middle ear by allowing air to move between the ear and nose. It also helps to drain fluid from the middle ear space. When the eustachian tube does not function properly, air pressure, fluid, or both can build up in the middle ear. Eustachian tube dysfunction can affect one or both ears. What are the causes? This condition happens when the eustachian tube becomes blocked or cannot open normally. This may result from:  Ear infections.  Colds and other upper respiratory infections.  Allergies.  Irritation, such as from cigarette smoke or acid from the stomach coming up into the esophagus (gastroesophageal reflux).  Sudden changes in air pressure, such as from descending in an airplane.  Abnormal growths in the nose or throat, such as nasal polyps, tumors, or enlarged tissue at the back of the throat (adenoids).  What increases the risk? This condition may be more likely to develop in people who smoke and people who are overweight. Eustachian tube dysfunction may also be more likely to develop in children, especially children who have:  Certain birth defects of the mouth, such as cleft palate.  Large tonsils and adenoids.  What are the signs or symptoms? Symptoms of this condition may include:  A feeling of fullness in the ear.  Ear pain.  Clicking or popping noises in the ear.  Ringing in the ear.  Hearing loss.  Loss of balance.  Symptoms may get worse when the air pressure around you changes, such as when you travel to an area of high elevation or fly on an airplane. How is this diagnosed? This condition may be diagnosed based on:  Your symptoms.  A physical exam of your ear, nose, and throat.  Tests, such as those that measure: ? The movement of your eardrum (tympanogram). ? Your hearing (audiometry).  How is this treated? Treatment  depends on the cause and severity of your condition. If your symptoms are mild, you may be able to relieve your symptoms by moving air into ("popping") your ears. If you have symptoms of fluid in your ears, treatment may include:  Decongestants.  Antihistamines.  Nasal sprays or ear drops that contain medicines that reduce swelling (steroids).  In some cases, you may need to have a procedure to drain the fluid in your eardrum (myringotomy). In this procedure, a small tube is placed in the eardrum to:  Drain the fluid.  Restore the air in the middle ear space.  Follow these instructions at home:  Take over-the-counter and prescription medicines only as told by your health care provider.  Use techniques to help pop your ears as recommended by your health care provider. These may include: ? Chewing gum. ? Yawning. ? Frequent, forceful swallowing. ? Closing your mouth, holding your nose closed, and gently blowing as if you are trying to blow air out of your nose.  Do not do any of the following until your health care provider approves: ? Travel to high altitudes. ? Fly in airplanes. ? Work in a pressurized cabin or room. ? Scuba dive.  Keep your ears dry. Dry your ears completely after showering or bathing.  Do not smoke.  Keep all follow-up visits as told by your health care provider. This is important. Contact a health care provider if:  Your symptoms do not go away after treatment.  Your symptoms come back after treatment.  You are   unable to pop your ears.  You have: ? A fever. ? Pain in your ear. ? Pain in your head or neck. ? Fluid draining from your ear.  Your hearing suddenly changes.  You become very dizzy.  You lose your balance. This information is not intended to replace advice given to you by your health care provider. Make sure you discuss any questions you have with your health care provider. Document Released: 09/05/2015 Document Revised: 01/15/2016  Document Reviewed: 08/28/2014 Elsevier Interactive Patient Education  2018 [employer].  Viral Illness, Adult Viruses are tiny germs that can get into a person's body and cause illness. There are many different types of viruses, and they cause many types of illness. Viral illnesses can range from mild to severe. They can affect various parts of the body. Common illnesses that are caused by a virus include colds and the flu. Viral illnesses also include serious conditions such as HIV/AIDS (human immunodeficiency virus/acquired immunodeficiency syndrome). A few viruses have been linked to certain cancers. What are the causes? Many types of viruses can cause illness. Viruses invade cells in your body, multiply, and cause the infected cells to malfunction or die. When the cell dies, it releases more of the virus. When this happens, you develop symptoms of the illness, and the virus continues to spread to other cells. If the virus takes over the function of the cell, it can cause the cell to divide and grow out of control, as is the case when a virus causes cancer. Different viruses get into the body in different ways. You can get a virus by:  Swallowing food or water that is contaminated with the virus.  Breathing in droplets that have been coughed or sneezed into the air by an infected person.  Touching a surface that has been contaminated with the virus and then touching your eyes, nose, or mouth.  Being bitten by an insect or animal that carries the virus.  Having sexual contact with a person who is infected with the virus.  Being exposed to blood or fluids that contain the virus, either through an open cut or during a transfusion.  If a virus enters your body, your body's defense system (immune system) will try to fight the virus. You may be at higher risk for a viral illness if your immune system is weak. What are the signs or symptoms? Symptoms vary depending on the type of virus and the  location of the cells that it invades. Common symptoms of the main types of viral illnesses include: Cold and flu viruses  Fever.  Headache.  Sore throat.  Muscle aches.  Nasal congestion.  Cough. Digestive system (gastrointestinal) viruses  Fever.  Abdominal pain.  Nausea.  Diarrhea. Liver viruses (hepatitis)  Loss of appetite.  Tiredness.  Yellowing of the skin (jaundice). Brain and spinal cord viruses  Fever.  Headache.  Stiff neck.  Nausea and vomiting.  Confusion or sleepiness. Skin viruses  Warts.  Itching.  Rash. Sexually transmitted viruses  Discharge.  Swelling.  Redness.  Rash. How is this treated? Viruses can be difficult to treat because they live within cells. Antibiotic medicines do not treat viruses because these drugs do not get inside cells. Treatment for a viral illness may include:  Resting and drinking plenty of fluids.  Medicines to relieve symptoms. These can include over-the-counter medicine for pain and fever, medicines for cough or congestion, and medicines to relieve diarrhea.  Antiviral medicines. These drugs are available only for certain  types of viruses. They may help reduce flu symptoms if taken early. There are also many antiviral medicines for hepatitis and HIV/AIDS.  Some viral illnesses can be prevented with vaccinations. A common example is the flu shot. Follow these instructions at home: Medicines   Take over-the-counter and prescription medicines only as told by your health care provider.  If you were prescribed an antiviral medicine, take it as told by your health care provider. Do not stop taking the medicine even if you start to feel better.  Be aware of when antibiotics are needed and when they are not needed. Antibiotics do not treat viruses. If your health care provider thinks that you may have a bacterial infection as well as a viral infection, you may get an antibiotic. ? Do not ask for an  antibiotic prescription if you have been diagnosed with a viral illness. That will not make your illness go away faster. ? Frequently taking antibiotics when they are not needed can lead to antibiotic resistance. When this develops, the medicine no longer works against the bacteria that it normally fights. General instructions  Drink enough fluids to keep your urine clear or pale yellow.  Rest as much as possible.  Return to your normal activities as told by your health care provider. Ask your health care provider what activities are safe for you.  Keep all follow-up visits as told by your health care provider. This is important. How is this prevented? Take these actions to reduce your risk of viral infection:  Eat a healthy diet and get enough rest.  Wash your hands often with soap and water. This is especially important when you are in public places. If soap and water are not available, use hand sanitizer.  Avoid close contact with friends and family who have a viral illness.  If you travel to areas where viral gastrointestinal infection is common, avoid drinking water or eating raw food.  Keep your immunizations up to date. Get a flu shot every year as told by your health care provider.  Do not share toothbrushes, nail clippers, razors, or needles with other people.  Always practice safe sex.  Contact a health care provider if:  You have symptoms of a viral illness that do not go away.  Your symptoms come back after going away.  Your symptoms get worse. Get help right away if:  You have trouble breathing.  You have a severe headache or a stiff neck.  You have severe vomiting or abdominal pain. This information is not intended to replace advice given to you by your health care provider. Make sure you discuss any questions you have with your health care provider. Document Released: 12/19/2015 Document Revised: 01/21/2016 Document Reviewed: 12/19/2015 Elsevier Interactive  Patient Education  [employer].

## 2018-06-20 NOTE — Progress Notes (Signed)
Subjective:  Patient ID: Cory Hoffman, male    DOB: May 30, 1976  Age: 42 y.o. MRN: [id number]  CC: head congestion (started on Saturday, no fever that patient knows of. Having chills & hot flashes, coughing. )   HPI Cory Hoffman presents for evaluation and treatment of a 3-day history of an illness that includes malaise, ear congestion, nasal congestion postnasal drip, lightheadedness and hot and cold spells.  Patient has tried over-the-counter decongestants without much relief.  He denies fever chills, reactive airway disease or wheezing.  He did not have ear issues when he was a child.  He denies myalgias and arthralgias at this time.  There is been no nausea and vomiting.  Outpatient Medications Prior to Visit  Medication Sig Dispense Refill  . atorvastatin (LIPITOR) 40 MG tablet Take 1 tablet (40 mg total) by mouth daily. 90 tablet 0  . omeprazole (PRILOSEC) 20 MG capsule Take 1 capsule (20 mg total) by mouth daily. Must keep scheduled appt w/new provider for refills 90 capsule 0  . pantoprazole (PROTONIX) 40 MG tablet TAKE 1 TABLET BY MOUTH EVERY DAY 90 tablet 1  . sucralfate (CARAFATE) 1 g tablet Take 1 tablet (1 g total) by mouth 4 (four) times daily -  with meals and at bedtime. 40 tablet 1  . varenicline (CHANTIX STARTING MONTH PAK) 0.5 MG X 11 & 1 MG X 42 tablet Take one 0.5 mg tabl po once daily for 3 days, then increase to one 0.5 mg tab bid for 4 days, then increase to one 1 mg tab bid. 53 tablet 0  . zolmitriptan (ZOMIG) 5 MG tablet Take 1 tablet (5 mg total) by mouth as needed for migraine. 10 tablet 0  . zolpidem (AMBIEN) 10 MG tablet TAKE 1 TABLET BY MOUTH AT BEDTIME AS NEEDED FOR SLEEP 30 tablet 5  . HYDROcodone-acetaminophen (NORCO) 5-325 MG tablet Take 1 tablet by mouth daily as needed for moderate pain. 7 tablet 0   No facility-administered medications prior to visit.     ROS Review of Systems  Constitutional: Positive for fatigue. Negative for chills, fever and  unexpected weight change.  HENT: Positive for congestion, ear pain, hearing loss, postnasal drip, sore throat and voice change. Negative for ear discharge, sinus pressure, sinus pain and trouble swallowing.   Eyes: Negative for photophobia and visual disturbance.  Respiratory: Positive for cough. Negative for shortness of breath and wheezing.   Cardiovascular: Negative.   Gastrointestinal: Negative for nausea and vomiting.  Genitourinary: Negative.   Musculoskeletal: Negative for arthralgias and myalgias.  Skin: Negative for color change and pallor.  Allergic/Immunologic: Negative for immunocompromised state.  Neurological: Positive for weakness, light-headedness and headaches.  Hematological: Does not bruise/bleed easily.  Psychiatric/Behavioral: Negative.     Objective:  BP 126/80 (BP Location: Right Arm, Patient Position: Sitting, Cuff Size: Normal)   Pulse 73   Temp 98.2 F (36.8 C) (Oral)   Ht 5\' 10"  (1.778 m)   Wt 206 lb 6 oz (93.6 kg)   SpO2 99%   BMI 29.61 kg/m   BP Readings from Last 3 Encounters:  06/20/18 126/80  03/10/18 130/80  01/19/18 110/82    Wt Readings from Last 3 Encounters:  06/20/18 206 lb 6 oz (93.6 kg)  03/10/18 209 lb (94.8 kg)  01/19/18 209 lb 3.2 oz (94.9 kg)    Physical Exam  Constitutional: He is oriented to person, place, and time. He appears well-developed and well-nourished. No distress.  HENT:  Head: Normocephalic  and atraumatic.  Right Ear: External ear normal. Tympanic membrane is not injected, not perforated and not erythematous. No middle ear effusion.  Left Ear: External ear normal. Tympanic membrane is retracted. Tympanic membrane is not injected, not perforated and not erythematous.  No middle ear effusion.  Mouth/Throat: No oropharyngeal exudate.  Eyes: Pupils are equal, round, and reactive to light. Conjunctivae and EOM are normal. Right eye exhibits no discharge. Left eye exhibits no discharge. No scleral icterus.  Neck: Neck  supple. No JVD present. No tracheal deviation present. No thyromegaly present.  Cardiovascular: Normal rate, regular rhythm and normal heart sounds.  Pulmonary/Chest: Effort normal and breath sounds normal. No stridor. No respiratory distress. He has no wheezes. He has no rales.  Abdominal: Bowel sounds are normal.  Lymphadenopathy:    He has no cervical adenopathy.  Neurological: He is alert and oriented to person, place, and time.  Skin: Skin is warm and dry. No rash noted. He is not diaphoretic.  Psychiatric: He has a normal mood and affect. His behavior is normal.    Lab Results  Component Value Date   WBC 9.2 03/10/2018   HGB 15.6 03/10/2018   HCT 45.9 03/10/2018   PLT 260.0 03/10/2018   GLUCOSE 116 (H) 03/10/2018   CHOL 171 01/19/2018   TRIG (H) 01/19/2018    474.0 Triglyceride is over 400; calculations on Lipids are invalid.   HDL 26.30 (L) 01/19/2018   LDLDIRECT 73.0 01/19/2018   ALT 28 03/10/2018   AST 15 03/10/2018   NA 142 03/10/2018   K 4.3 03/10/2018   CL 107 03/10/2018   CREATININE 1.07 03/10/2018   BUN 8 03/10/2018   CO2 30 03/10/2018    Mr Knee Left W/o Contrast  Result Date: 03/12/2018 CLINICAL DATA:  Chronic on and off knee pain and swelling for years. Prior history of surgery. EXAM: MRI OF THE LEFT KNEE WITHOUT CONTRAST TECHNIQUE: Multiplanar, multisequence MR imaging of the knee was performed. No intravenous contrast was administered. COMPARISON:  09/22/2017 FINDINGS: MENISCI Medial meniscus:  Intact Lateral meniscus:  Intact LIGAMENTS Cruciates:  Intact Collaterals:  Intact CARTILAGE Patellofemoral: Mild degenerative chondrosis. Improved appearance of the previous area of chondromalacia at the patellar apex. Medial:  Normal Lateral:  Normal Joint:  Small joint effusion. Popliteal Fossa:  No popliteal mass or Baker's cyst. Extensor Mechanism: The patella retinacular structures are intact and the quadriceps and patellar tendons are intact. Bones: No acute bony  findings. No bone lesion or osteochondral abnormality. Other: Normal knee musculature. IMPRESSION: 1. Intact ligamentous structures and no acute bony findings. 2. No meniscal tears. 3. 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 4. Small joint effusion. Electronically Signed   By: Rudie Meyer M.D.   On: 03/12/2018 14:48    Assessment & Plan:   Akito was seen today for head congestion.  Diagnoses and all orders for this visit:  Dysfunction of both eustachian tubes -     predniSONE (DELTASONE) 20 MG tablet; Take 1 tablet (20 mg total) by mouth 2 (two) times daily with a meal for 7 days.  Laryngitis -     benzonatate (TESSALON) 100 MG capsule; Take 1 capsule (100 mg total) by mouth 2 (two) times daily as needed for cough.  Viral syndrome -     benzonatate (TESSALON) 100 MG capsule; Take 1 capsule (100 mg total) by mouth 2 (two) times daily as needed for cough.   I have discontinued Anothony H. Kister's HYDROcodone-acetaminophen. I  am also having him start on predniSONE and benzonatate. Additionally, I am having him maintain his zolmitriptan, varenicline, atorvastatin, omeprazole, zolpidem, sucralfate, and pantoprazole.  Meds ordered this encounter  Medications  . predniSONE (DELTASONE) 20 MG tablet    Sig: Take 1 tablet (20 mg total) by mouth 2 (two) times daily with a meal for 7 days.    Dispense:  14 tablet    Refill:  0  . benzonatate (TESSALON) 100 MG capsule    Sig: Take 1 capsule (100 mg total) by mouth 2 (two) times daily as needed for cough.    Dispense:  20 capsule    Refill:  0     Follow-up: Return in about 1 week (around 06/27/2018), or if symptoms worsen or fail to improve.  Mliss Sax, MD

## 2018-06-22 ENCOUNTER — Telehealth (INDEPENDENT_AMBULATORY_CARE_PROVIDER_SITE_OTHER): Payer: Self-pay

## 2018-06-22 NOTE — Telephone Encounter (Signed)
Called and left a VM advising patient to give [facility] a call to give his consent to have Monovisc gel injection delivered to our office.   Advised by Jonny Ruiz with [facility] that they have tried calling patient several times to get his consent, but no answer.  Advised patient to call back with any questions.

## 2018-07-18 ENCOUNTER — Other Ambulatory Visit: Payer: Self-pay | Admitting: Family Medicine

## 2018-08-01 DIAGNOSIS — M25512 Pain in left shoulder: Secondary | ICD-10-CM | POA: Diagnosis not present

## 2018-08-01 DIAGNOSIS — M542 Cervicalgia: Secondary | ICD-10-CM | POA: Diagnosis not present

## 2018-09-14 IMAGING — MR MR KNEE*L* W/O CM
5 of 8 series · 24 of 40 positions shown · non-contrast
Comparison: 09/22/2017

CLINICAL DATA: Chronic on and off knee pain and swelling for years.
Prior history of surgery.

EXAM:
MRI OF THE LEFT KNEE WITHOUT CONTRAST
TECHNIQUE: Multiplanar, multisequence MR imaging of the knee was performed. No
intravenous contrast was administered.

[Series 3: [id number]_tra · axial · 4.0mm · 0.50mm/px · z∈[-117,-88]mm · 2 of 26 slices shown]
[im 1/26]
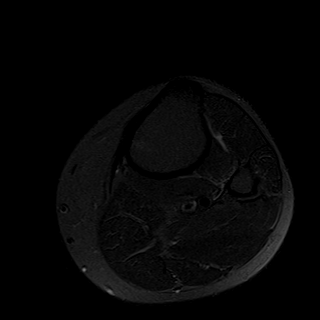
[im 7/26]
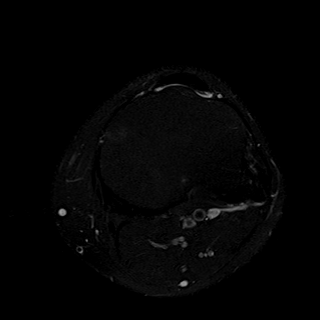

[Series 4: T2 fat-sat · coronal · 4.0mm · 0.59mm/px · 5 of 26 slices shown (1 of 2)]
[im 1/26]
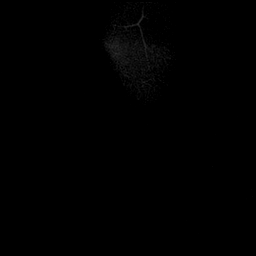
[im 7/26]
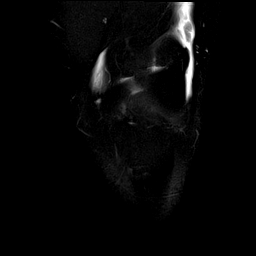
[im 13/26]
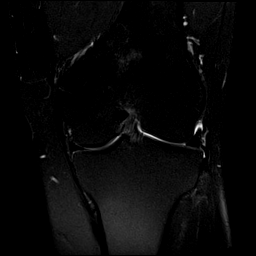
[im 19/26]
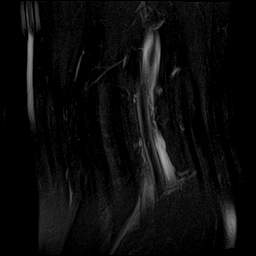
[im 26/26]
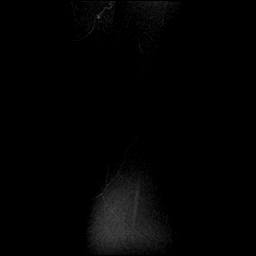

[Series 5: T1 · coronal · 4.0mm · 0.29mm/px · 5 of 27 slices shown]
[im 1/27]
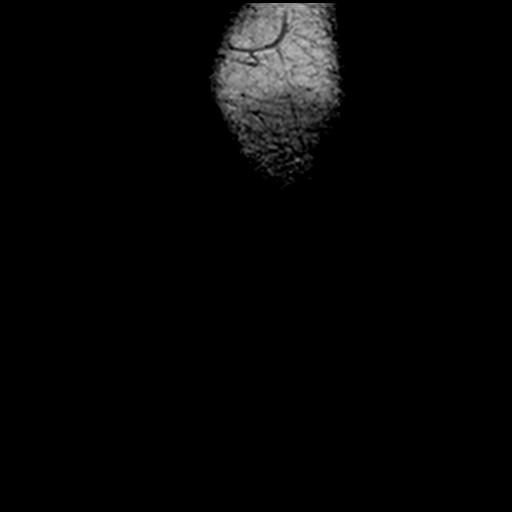
[im 7/27]
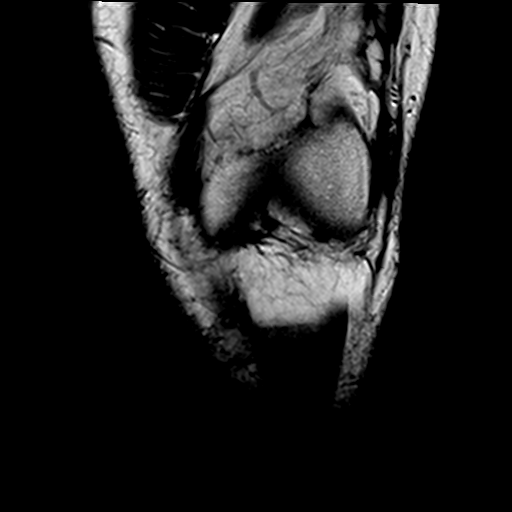
[im 14/27]
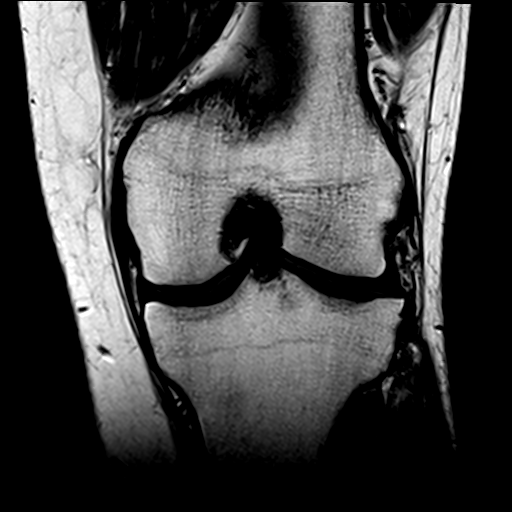
[im 20/27]
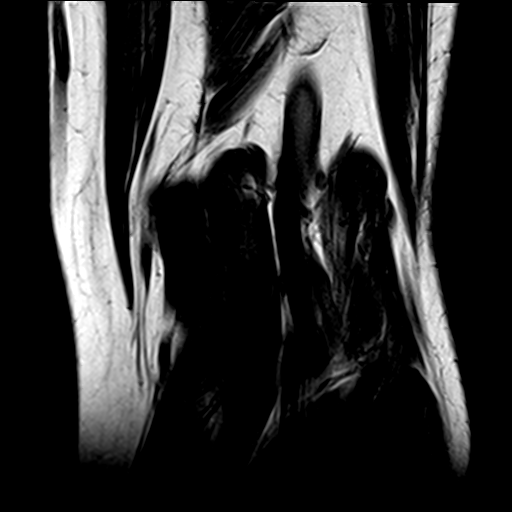
[im 27/27]
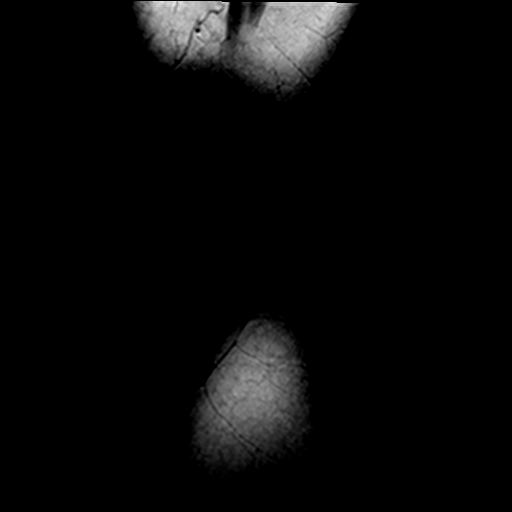

[Series 7: PD fat-sat · sagittal · 3.0mm · 0.29mm/px · 6 of 30 slices shown]
[im 1/30]
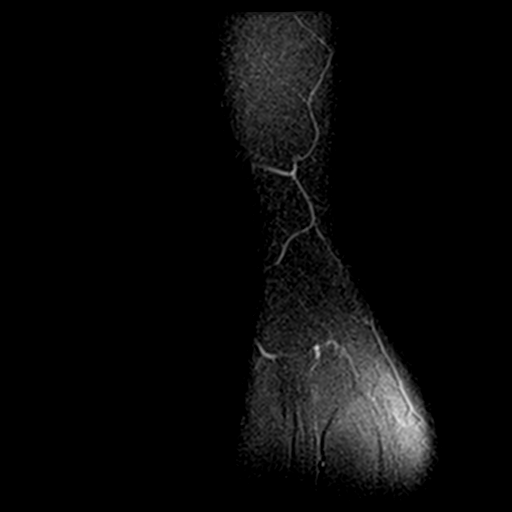
[im 6/30]
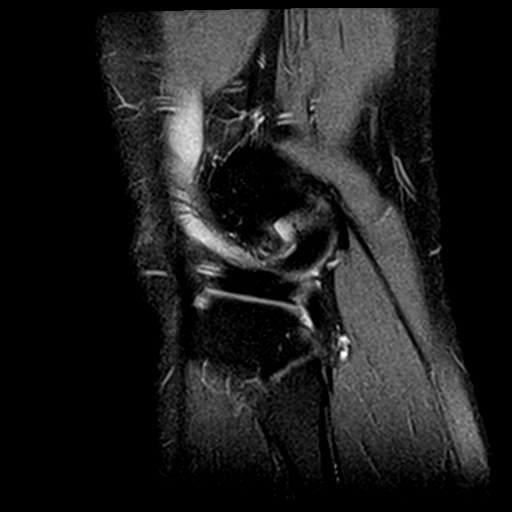
[im 12/30]
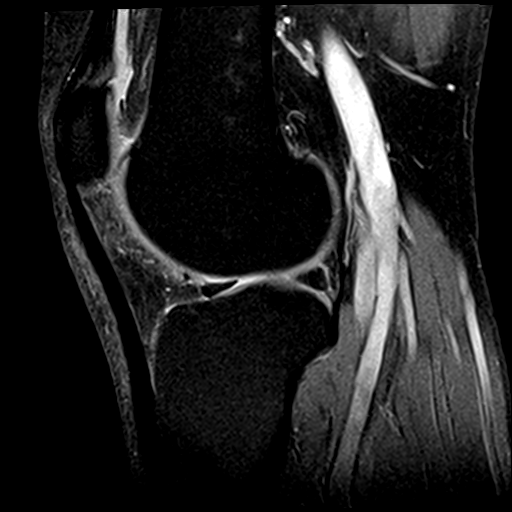
[im 18/30]
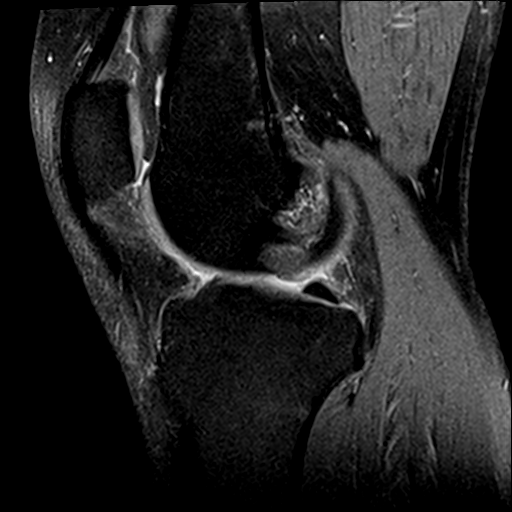
[im 24/30]
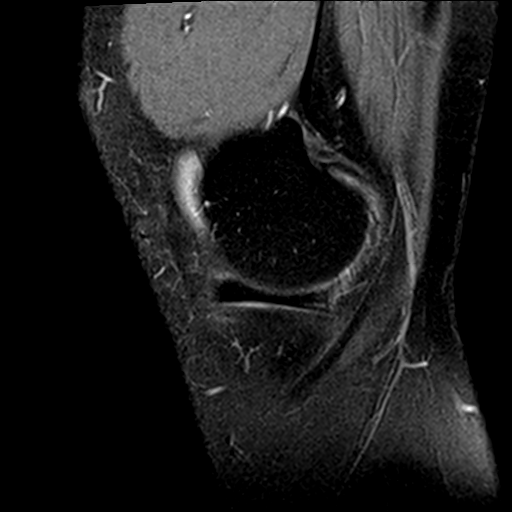
[im 30/30]
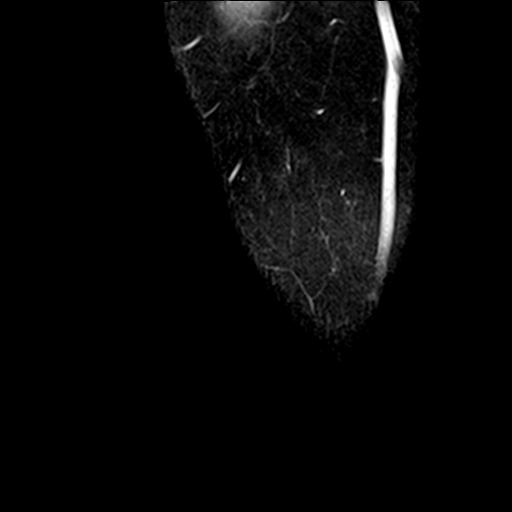

[Series 8: T2 fat-sat · sagittal · 3.0mm · 0.29mm/px · 6 of 30 slices shown (2 of 2)]
[im 1/30]
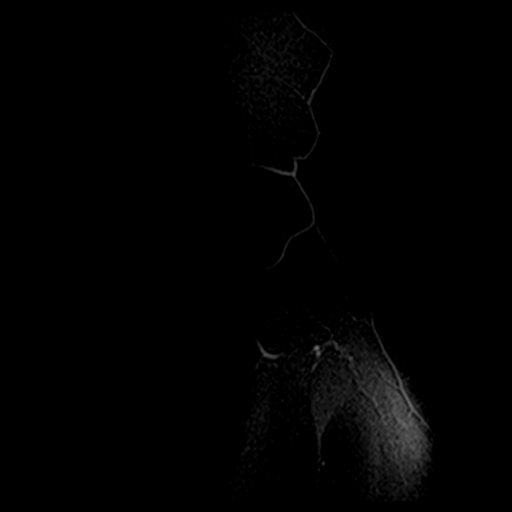
[im 6/30]
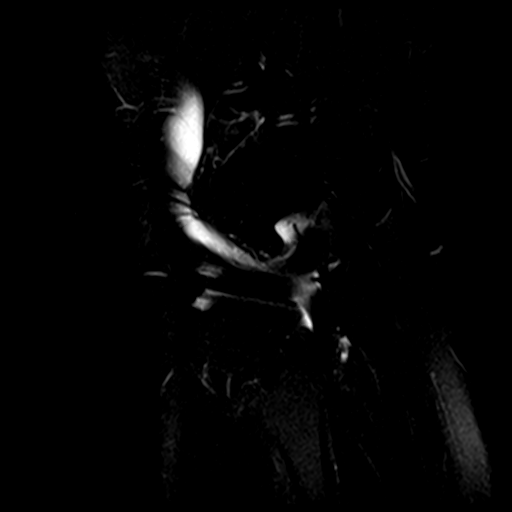
[im 12/30]
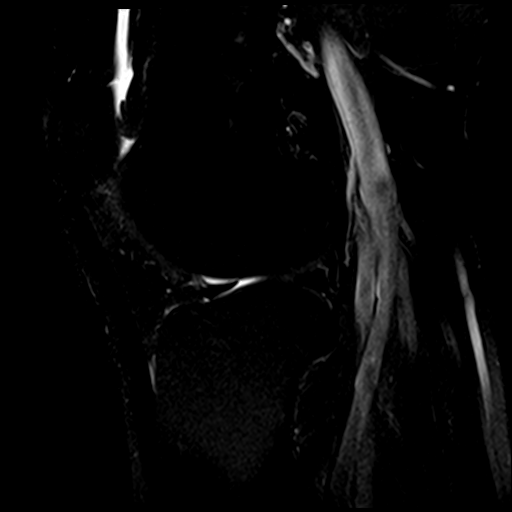
[im 18/30]
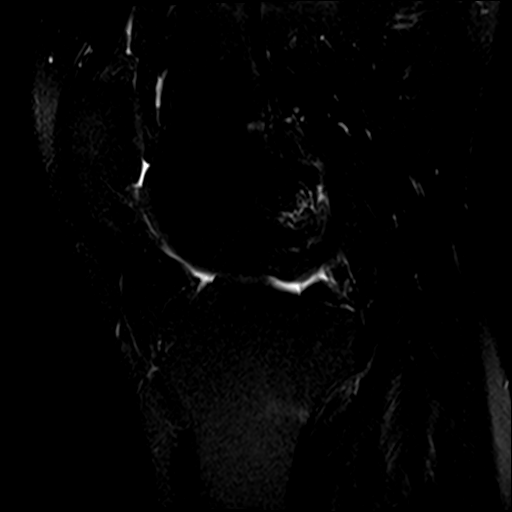
[im 24/30]
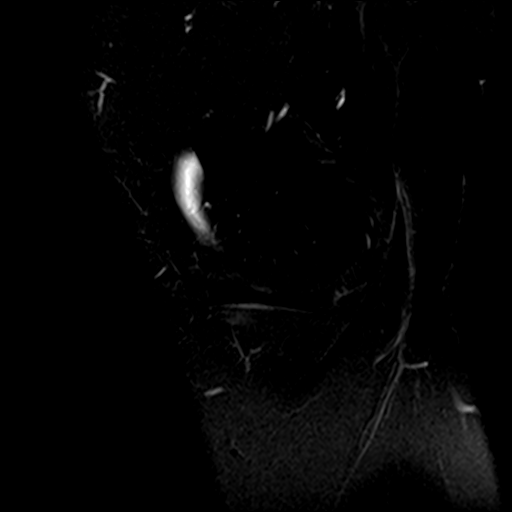
[im 30/30]
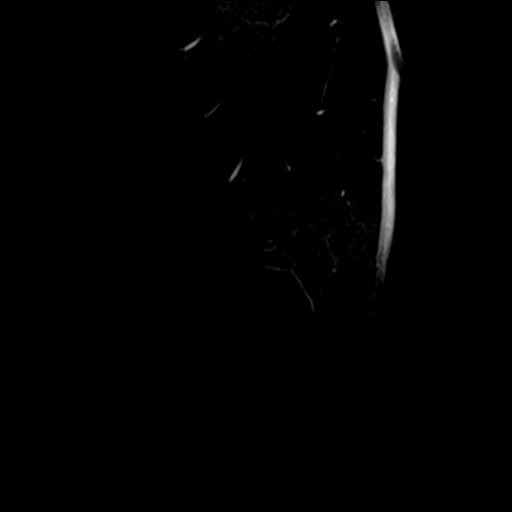

[24 of 40 positions shown; findings below may reference images not displayed]

FINDINGS: MENISCI

Medial meniscus:  Intact

Lateral meniscus:  Intact

LIGAMENTS

Cruciates:  Intact

Collaterals:  Intact

CARTILAGE

Patellofemoral: Mild degenerative chondrosis. Improved appearance of
the previous area of chondromalacia at the patellar apex.

Medial:  Normal

Lateral:  Normal

Joint:  Small joint effusion.

Popliteal Fossa:  No popliteal mass or Baker's cyst.

Extensor Mechanism: The patella retinacular structures are intact
and the quadriceps and patellar tendons are intact.

Bones: No acute bony findings. No bone lesion or osteochondral
abnormality.

Other: Normal knee musculature.
IMPRESSION: 1. Intact ligamentous structures and no acute bony findings.
2. No meniscal tears.
3. Mild degenerative chondrosis/chondromalacia involving the
patellar cartilage but improved since prior study.
4. Small joint effusion.

## 2018-09-20 DIAGNOSIS — M4722 Other spondylosis with radiculopathy, cervical region: Secondary | ICD-10-CM | POA: Diagnosis not present

## 2018-09-20 DIAGNOSIS — M542 Cervicalgia: Secondary | ICD-10-CM | POA: Diagnosis not present

## 2018-10-23 ENCOUNTER — Ambulatory Visit (INDEPENDENT_AMBULATORY_CARE_PROVIDER_SITE_OTHER): Payer: 59 | Admitting: Family Medicine

## 2018-10-23 ENCOUNTER — Other Ambulatory Visit: Payer: Self-pay

## 2018-10-23 ENCOUNTER — Encounter: Payer: Self-pay | Admitting: Family Medicine

## 2018-10-23 VITALS — BP 134/96 | HR 81 | Temp 97.9°F | Resp 18 | Ht 70.0 in | Wt 208.8 lb

## 2018-10-23 DIAGNOSIS — R509 Fever, unspecified: Secondary | ICD-10-CM

## 2018-10-23 DIAGNOSIS — J101 Influenza due to other identified influenza virus with other respiratory manifestations: Secondary | ICD-10-CM | POA: Diagnosis not present

## 2018-10-23 LAB — POCT INFLUENZA A/B
INFLUENZA B, POC: NEGATIVE
Influenza A, POC: POSITIVE — AB

## 2018-10-23 MED ORDER — OSELTAMIVIR PHOSPHATE 75 MG PO CAPS: 75.0000 mg | ORAL_CAPSULE | Freq: Two times a day (BID) | ORAL | 0 refills | Status: AC

## 2018-10-23 NOTE — Patient Instructions (Signed)

## 2018-10-23 NOTE — Assessment & Plan Note (Signed)
-  Positive influenza A -Rx for tamiflu as symptom onset <48 hours -Increase fluid intake -Rest -Call for worsening or development of new symptoms.

## 2018-10-23 NOTE — Progress Notes (Signed)
Cory Hoffman - 43 y.o. male MRN [id number]  Date of birth: 08/18/1976  Subjective Chief Complaint  Patient presents with  . Fatigue  . Sore Throat  . Fever    102 last night, pt has taken tylenol     HPI Cory Hoffman is a 43 y.o. male here today with complaint of fever, sore throat, headache and fatigue.  He reports that symptoms started yesterday and have continued into today.  Temp last night and this morning of 102.  He has occasional dry cough but denies shortness of breath or wheezing.  He has had decreased appetite today and has not been drinking a whole lot.  He is taking OTC cold and flu medication with relief of fever.   ROS:  A comprehensive ROS was completed and negative except as noted per HPI  No Known Allergies  Past Medical History:  Diagnosis Date  . Chronic headaches   . Frequent headaches   . HTN (hypertension)   . Hyperlipidemia   . Migraines     Past Surgical History:  Procedure Laterality Date  . BACK SURGERY    . KNEE ARTHROSCOPY    . WISDOM TOOTH EXTRACTION      Social History   Socioeconomic History  . Marital status: Married    Spouse name: Not on file  . Number of children: 4  . Years of education: 85  . Highest education level: Not on file  Occupational History  . Occupation: Data processing manager  Social Needs  . Financial resource strain: Not on file  . Food insecurity:    Worry: Not on file    Inability: Not on file  . Transportation needs:    Medical: Not on file    Non-medical: Not on file  Tobacco Use  . Smoking status: Current Every Day Smoker    Packs/day: 1.00    Years: 15.00    Pack years: 15.00    Types: Cigarettes  . Smokeless tobacco: Never Used  Substance and Sexual Activity  . Alcohol use: Yes    Alcohol/week: 0.0 standard drinks    Comment: social/weekly  . Drug use: No  . Sexual activity: Yes  Lifestyle  . Physical activity:    Days per week: Not on file    Minutes per session: Not on file  . Stress: Not  on file  Relationships  . Social connections:    Talks on phone: Not on file    Gets together: Not on file    Attends religious service: Not on file    Active member of club or organization: Not on file    Attends meetings of clubs or organizations: Not on file    Relationship status: Not on file  Other Topics Concern  . Not on file  Social History Narrative   Fun/Hobby: Racing, Photographer.     Family History  Problem Relation Age of Onset  . Heart disease Mother   . Cancer - Lung Mother   . Heart disease Father   . Stroke Paternal Grandmother   . Heart attack Paternal Grandfather        Mid 34s    Health Maintenance  Topic Date Due  . Janet Berlin  07/12/1997  . INFLUENZA VACCINE  03/23/2018  . HIV Screening  01/20/2019 (Originally 07/12/1993)    ----------------------------------------------------------------------------------------------------------------------------------------------------------------------------------------------------------------- Physical Exam BP (!) 134/96 (BP Location: Left Arm, Patient Position: Sitting, Cuff Size: Large)   Pulse 81   Temp 97.9 F (36.6 C) (Oral)  Resp 18   Ht 5\' 10"  (1.778 m)   Wt 208 lb 12.8 oz (94.7 kg)   SpO2 96%   BMI 29.96 kg/m   Physical Exam Constitutional:      Appearance: He is well-developed.  HENT:     Head: Normocephalic and atraumatic.     Right Ear: Tympanic membrane normal.     Left Ear: Tympanic membrane normal.     Mouth/Throat:     Mouth: Mucous membranes are moist.  Eyes:     General: No scleral icterus. Neck:     Musculoskeletal: Neck supple.  Cardiovascular:     Rate and Rhythm: Normal rate and regular rhythm.  Pulmonary:     Effort: Pulmonary effort is normal.     Breath sounds: Normal breath sounds.  Musculoskeletal: Normal range of motion.  Skin:    General: Skin is warm and dry.  Neurological:     General: No focal deficit present.     Mental Status: He is alert and oriented to  person, place, and time.  Psychiatric:        Mood and Affect: Mood normal.        Behavior: Behavior normal.     ------------------------------------------------------------------------------------------------------------------------------------------------------------------------------------------------------------------- Assessment and Plan  Influenza A -Positive influenza A -Rx for tamiflu as symptom onset <48 hours -Increase fluid intake -Rest -Call for worsening or development of new symptoms.

## 2018-11-27 ENCOUNTER — Other Ambulatory Visit: Payer: Self-pay | Admitting: Family Medicine

## 2018-11-27 DIAGNOSIS — K2100 Gastro-esophageal reflux disease with esophagitis, without bleeding: Secondary | ICD-10-CM

## 2019-01-08 ENCOUNTER — Other Ambulatory Visit: Payer: Self-pay | Admitting: Family Medicine

## 2019-02-18 ENCOUNTER — Other Ambulatory Visit: Payer: Self-pay | Admitting: Family Medicine

## 2019-02-18 DIAGNOSIS — K2100 Gastro-esophageal reflux disease with esophagitis, without bleeding: Secondary | ICD-10-CM

## 2019-05-01 ENCOUNTER — Other Ambulatory Visit: Payer: Self-pay | Admitting: Family Medicine

## 2019-05-02 ENCOUNTER — Telehealth: Payer: Self-pay

## 2019-05-02 NOTE — Telephone Encounter (Signed)
Can we get pt scheduled for a follow up?  Thanks!

## 2019-05-02 NOTE — Telephone Encounter (Signed)
Needs f/u for further refills

## 2019-05-02 NOTE — Telephone Encounter (Signed)
Message sent to front staff for scheduling.

## 2019-05-03 NOTE — Telephone Encounter (Signed)
I called and left message on patient voicemail to call office and schedule follow up appointment.  °

## 2019-05-26 ENCOUNTER — Other Ambulatory Visit: Payer: Self-pay | Admitting: Family Medicine

## 2019-05-26 DIAGNOSIS — K2100 Gastro-esophageal reflux disease with esophagitis, without bleeding: Secondary | ICD-10-CM

## 2019-05-30 ENCOUNTER — Other Ambulatory Visit: Payer: Self-pay | Admitting: Family Medicine

## 2019-05-30 NOTE — Telephone Encounter (Signed)
Please advise on refill.

## 2019-05-30 NOTE — Telephone Encounter (Signed)
Will address at appt tomorrow.  Thanks!

## 2019-05-31 ENCOUNTER — Other Ambulatory Visit: Payer: Self-pay

## 2019-05-31 ENCOUNTER — Encounter: Payer: Self-pay | Admitting: Family Medicine

## 2019-05-31 ENCOUNTER — Ambulatory Visit (INDEPENDENT_AMBULATORY_CARE_PROVIDER_SITE_OTHER): Payer: BLUE CROSS/BLUE SHIELD | Admitting: Family Medicine

## 2019-05-31 VITALS — Ht 70.0 in | Wt 206.0 lb

## 2019-05-31 DIAGNOSIS — K219 Gastro-esophageal reflux disease without esophagitis: Secondary | ICD-10-CM

## 2019-05-31 DIAGNOSIS — G4700 Insomnia, unspecified: Secondary | ICD-10-CM

## 2019-05-31 DIAGNOSIS — E7800 Pure hypercholesterolemia, unspecified: Secondary | ICD-10-CM

## 2019-05-31 MED ORDER — ZOLPIDEM TARTRATE 10 MG PO TABS: ORAL_TABLET | ORAL | 5 refills | Status: DC

## 2019-05-31 MED ORDER — OMEPRAZOLE 20 MG PO CPDR: 20.0000 mg | DELAYED_RELEASE_CAPSULE | Freq: Every day | ORAL | 1 refills | Status: DC

## 2019-05-31 MED ORDER — ATORVASTATIN CALCIUM 40 MG PO TABS: 40.0000 mg | ORAL_TABLET | Freq: Every day | ORAL | 1 refills | Status: DC

## 2019-05-31 NOTE — Assessment & Plan Note (Signed)
-  Well controlled with omeprazole, continue at current dose.  Medication refilled.

## 2019-05-31 NOTE — Progress Notes (Signed)
Cory Hoffman - 43 y.o. male MRN [id number]  Date of birth: [birth year]   This visit type was conducted due to national recommendations for restrictions regarding the COVID-19 Pandemic (e.g. social distancing).  This format is felt to be most appropriate for this patient at this time.  All issues noted in this document were discussed and addressed.  No physical exam was performed (except for noted visual exam findings with Video Visits).  I discussed the limitations of evaluation and management by telemedicine and the availability of in person appointments. The patient expressed understanding and agreed to proceed.  I connected with@ on 05/31/19 at  8:00 AM EDT by a video enabled telemedicine application and verified that I am speaking with the correct person using two identifiers.  Present for video visit:  Eland Delorise Shiner, DO   Patient Location: Home [place] [address]   Provider location:   Home office  Chief Complaint  Patient presents with  . Follow-up    medications refill: ambien,omeprazole and atorvastatin.     HPI  Cory Hoffman is a 43 y.o. male who presents via audio/video conferencing for a telehealth visit today.   He is following up today for GERD, insomnia and HLD.  He reports he is doing well at this time.  He continues to sleep well with ambien.  He denies side effects from this including morning fatigue.  He is tolerating atorvastatin well.  He is due to have updated labs.  He denies myalgias or abdominal pain.   GERD is well controlled with omeprazole.  He denies nausea or dark stools.    ROS:  A comprehensive ROS was completed and negative except as noted per HPI      ROS:  A comprehensive ROS was completed and negative except as noted per HPI  Past Medical History:  Diagnosis Date  . Chronic headaches   . Frequent headaches   . HTN (hypertension)   . Hyperlipidemia   . Migraines     Past Surgical History:   Procedure Laterality Date  . BACK SURGERY    . KNEE ARTHROSCOPY    . WISDOM TOOTH EXTRACTION      Family History  Problem Relation Age of Onset  . Heart disease Mother   . Cancer - Lung Mother   . Heart disease Father   . Stroke Paternal Grandmother   . Heart attack Paternal Grandfather        Mid 28s    Social History   Socioeconomic History  . Marital status: Married    Spouse name: Not on file  . Number of children: 4  . Years of education: 38  . Highest education level: Not on file  Occupational History  . Occupation: Air cabin crew  Social Needs  . Financial resource strain: Not on file  . Food insecurity    Worry: Not on file    Inability: Not on file  . Transportation needs    Medical: Not on file    Non-medical: Not on file  Tobacco Use  . Smoking status: Current Every Day Smoker    Packs/day: 1.00    Years: 15.00    Pack years: 15.00    Types: Cigarettes  . Smokeless tobacco: Never Used  Substance and Sexual Activity  . Alcohol use: Yes    Alcohol/week: 0.0 standard drinks    Comment: social/weekly  . Drug use: No  . Sexual activity: Yes  Lifestyle  . Physical  activity    Days per week: Not on file    Minutes per session: Not on file  . Stress: Not on file  Relationships  . Social Musician on phone: Not on file    Gets together: Not on file    Attends religious service: Not on file    Active member of club or organization: Not on file    Attends meetings of clubs or organizations: Not on file    Relationship status: Not on file  . Intimate partner violence    Fear of current or ex partner: Not on file    Emotionally abused: Not on file    Physically abused: Not on file    Forced sexual activity: Not on file  Other Topics Concern  . Not on file  Social History Narrative   Fun/Hobby: Racing, Photographer.      Current Outpatient Medications:  .  atorvastatin (LIPITOR) 40 MG tablet, Take 1 tablet (40 mg total) by mouth daily.,  Disp: 90 tablet, Rfl: 0 .  omeprazole (PRILOSEC) 20 MG capsule, TAKE 1 CAPSULE (20 MG TOTAL) BY MOUTH DAILY. MUST KEEP SCHEDULED APPT W/NEW PROVIDER FOR REFILLS, Disp: 90 capsule, Rfl: 0 .  zolpidem (AMBIEN) 10 MG tablet, TAKE 1 TABLET BY MOUTH EVERY DAY AT BEDTIME AS NEEDED FOR SLEEP, Disp: 30 tablet, Rfl: 0 .  varenicline (CHANTIX STARTING MONTH PAK) 0.5 MG X 11 & 1 MG X 42 tablet, Take one 0.5 mg tabl po once daily for 3 days, then increase to one 0.5 mg tab bid for 4 days, then increase to one 1 mg tab bid. (Patient not taking: Reported on 10/23/2018), Disp: 53 tablet, Rfl: 0 .  zolmitriptan (ZOMIG) 5 MG tablet, Take 1 tablet (5 mg total) by mouth as needed for migraine. (Patient not taking: Reported on 10/23/2018), Disp: 10 tablet, Rfl: 0  EXAM:  VITALS per patient if applicable: Ht 5\' 10"  (1.778 m)   Wt 206 lb (93.4 kg) Comment: pt reprt  BMI 29.56 kg/m   GENERAL: alert, oriented, appears well and in no acute distress  HEENT: atraumatic, conjunttiva clear, no obvious abnormalities on inspection of external nose and ears  NECK: normal movements of the head and neck  LUNGS: on inspection no signs of respiratory distress, breathing rate appears normal, no obvious gross SOB, gasping or wheezing  CV: no obvious cyanosis  MS: moves all visible extremities without noticeable abnormality  PSYCH/NEURO: pleasant and cooperative, no obvious depression or anxiety, speech and thought processing grossly intact  ASSESSMENT AND PLAN:  Discussed the following assessment and plan:  Gastroesophageal reflux disease without esophagitis -Well controlled with omeprazole, continue at current dose.  Medication refilled.   Insomnia -Stable with current dose of ambien as needed.  Will continue.  PDMP reviewed and rx renewed.    Hypercholesteremia Continue atorvastatin.  He will come in for labs, orders entered.        I discussed the assessment and treatment plan with the patient. The patient was  provided an opportunity to ask questions and all were answered. The patient agreed with the plan and demonstrated an understanding of the instructions.   The patient was advised to call back or seek an in-person evaluation if the symptoms worsen or if the condition fails to improve as anticipated.    , DO

## 2019-05-31 NOTE — Assessment & Plan Note (Signed)
-  Stable with current dose of ambien as needed.  Will continue.  PDMP reviewed and rx renewed.

## 2019-05-31 NOTE — Assessment & Plan Note (Signed)
Continue atorvastatin.  He will come in for labs, orders entered.

## 2019-07-02 ENCOUNTER — Ambulatory Visit: Payer: Self-pay | Admitting: *Deleted

## 2019-07-02 NOTE — Telephone Encounter (Signed)
Calls with a pain at the tip of sternum that occurs with meals. Has been occurring intermittently for 2 weeks. Does experience a burning sensation immediately following the pain. Takes omeprazole at hs and has tried Tums that did not help. Denies SOB/dizziness/N/V/diarrhea/fever. Care Advice including sitting upright after meals/avoid greasy foods. Also has a knot behind the right ear he would like looked at. Requesting Wednesday appointment. Covid screening-no fever/no contacts/no travels. Scheduled labs that are due and appointment. Reviewed worsening symptoms that would need immediate attention.   Reason for Disposition . [1] MILD pain (e.g., does not interfere with normal activities) AND [2] pain comes and goes (cramps) [3] present > 48 hours  Answer Assessment - Initial Assessment Questions 1. LOCATION: "Where does it hurt?"      Tip of sternum  2. RADIATION: "Does the pain shoot anywhere else?" (e.g., chest, back)     Upwards slightly 3. ONSET: "When did the pain begin?" (Minutes, hours or days ago)     2 weeks 4. SUDDEN: "Gradual or sudden onset?"     Gradual 5. PATTERN "Does the pain come and go, or is it constant?"    - If constant: "Is it getting better, staying the same, or worsening?"      (Note: Constant means the pain never goes away completely; most serious pain is constant and it progresses)     - If intermittent: "How long does it last?" "Do you have pain now?"     (Note: Intermittent means the pain goes away completely between bouts)     Comes and goes 6. SEVERITY: "How bad is the pain?"  (e.g., Scale 1-10; mild, moderate, or severe)    - MILD (1-3): doesn't interfere with normal activities, abdomen soft and not tender to touch     - MODERATE (4-7): interferes with normal activities or awakens from sleep, tender to touch     - SEVERE (8-10): excruciating pain, doubled over, unable to do any normal activities       7 7. RECURRENT SYMPTOM: "Have you ever had this type of  abdominal pain before?" If so, ask: "When was the last time?" and "What happened that time?"      Yes, reflux 8. CAUSE: "What do you think is causing the abdominal pain?" reslux    9. RELIEVING/AGGRAVATING FACTORS: "What makes it better or worse?" (e.g., movement, antacids, bowel movement)     nothing10. OTHER SYMPTOMS: "Has there been any vomiting, diarrhea, constipation, or urine problems?"       no  Protocols used: ABDOMINAL PAIN - MALE-A-AH

## 2019-07-03 ENCOUNTER — Telehealth: Payer: Self-pay

## 2019-07-03 NOTE — Telephone Encounter (Signed)

## 2019-07-03 NOTE — Telephone Encounter (Signed)

## 2019-07-04 ENCOUNTER — Other Ambulatory Visit (INDEPENDENT_AMBULATORY_CARE_PROVIDER_SITE_OTHER): Payer: BLUE CROSS/BLUE SHIELD

## 2019-07-04 ENCOUNTER — Other Ambulatory Visit: Payer: Self-pay

## 2019-07-04 ENCOUNTER — Ambulatory Visit: Payer: BLUE CROSS/BLUE SHIELD | Admitting: Family Medicine

## 2019-07-04 ENCOUNTER — Encounter: Payer: Self-pay | Admitting: Family Medicine

## 2019-07-04 VITALS — BP 130/82 | HR 71 | Temp 97.4°F | Ht 70.0 in | Wt 206.4 lb

## 2019-07-04 DIAGNOSIS — K2900 Acute gastritis without bleeding: Secondary | ICD-10-CM

## 2019-07-04 DIAGNOSIS — E7800 Pure hypercholesterolemia, unspecified: Secondary | ICD-10-CM

## 2019-07-04 NOTE — Assessment & Plan Note (Addendum)
-  Continue omeprazole.  Check H. Pylori breath test.  Advised to avoid all NSAIDS for now.   -discussed red flags including dark stools, increasing fatigue or dyspnea, or vomiting blood/coffee ground material.

## 2019-07-04 NOTE — Patient Instructions (Signed)
Gastritis, Adult ° °Gastritis is swelling (inflammation) of the stomach. Gastritis can develop quickly (acute). It can also develop slowly over time (chronic). It is important to get help for this condition. If you do not get help, your stomach can bleed, and you can get sores (ulcers) in your stomach. °What are the causes? °This condition may be caused by: °· Germs that get to your stomach. °· Drinking too much alcohol. °· Medicines you are taking. °· Too much acid in the stomach. °· A disease of the intestines or stomach. °· Stress. °· An allergic reaction. °· Crohn's disease. °· Some cancer treatments (radiation). °Sometimes the cause of this condition is not known. °What are the signs or symptoms? °Symptoms of this condition include: °· Pain in your stomach. °· A burning feeling in your stomach. °· Feeling sick to your stomach (nauseous). °· Throwing up (vomiting). °· Feeling too full after you eat. °· Weight loss. °· Bad breath. °· Throwing up blood. °· Blood in your poop (stool). °How is this diagnosed? °This condition may be diagnosed with: °· Your medical history and symptoms. °· A physical exam. °· Tests. These can include: °? Blood tests. °? Stool tests. °? A procedure to look inside your stomach (upper endoscopy). °? A test in which a sample of tissue is taken for testing (biopsy). °How is this treated? °Treatment for this condition depends on what caused it. You may be given: °· Antibiotic medicine, if your condition was caused by germs. °· H2 blockers and similar medicines, if your condition was caused by too much acid. °Follow these instructions at home: °Medicines °· Take over-the-counter and prescription medicines only as told by your doctor. °· If you were prescribed an antibiotic medicine, take it as told by your doctor. Do not stop taking it even if you start to feel better. °Eating and drinking ° °· Eat small meals often, instead of large meals. °· Avoid foods and drinks that make your symptoms  worse. °· Drink enough fluid to keep your pee (urine) pale yellow. °Alcohol use °· Do not drink alcohol if: °? Your doctor tells you not to drink. °? You are pregnant, may be pregnant, or are planning to become pregnant. °· If you drink alcohol: °? Limit your use to: °§ 0-1 drink a day for women. °§ 0-2 drinks a day for men. °? Be aware of how much alcohol is in your drink. In the [place] one drink equals one 12 oz bottle of beer (355 mL), one 5 oz glass of wine (148 mL), or one 1½ oz glass of hard liquor (44 mL). °General instructions °· Talk with your doctor about ways to manage stress. You can exercise or do deep breathing, meditation, or yoga. °· Do not smoke or use products that have nicotine or tobacco. If you need help quitting, ask your doctor. °· Keep all follow-up visits as told by your doctor. This is important. °Contact a doctor if: °· Your symptoms get worse. °· Your symptoms go away and then come back. °Get help right away if: °· You throw up blood or something that looks like coffee grounds. °· You have black or dark red poop. °· You throw up any time you try to drink fluids. °· Your stomach pain gets worse. °· You have a fever. °· You do not feel better after one week. °Summary °· Gastritis is swelling (inflammation) of the stomach. °· You must get help for this condition. If you do not get help, your stomach   can bleed, and you can get sores (ulcers). °· This condition is diagnosed with medical history, physical exam, or tests. °· You can be treated with medicines for germs or medicines to block too much acid in your stomach. °This information is not intended to replace advice given to you by your health care provider. Make sure you discuss any questions you have with your health care provider. °Document Released: 01/26/2008 Document Revised: 12/27/2017 Document Reviewed: 12/27/2017 °Elsevier Patient Education © 2020 [employer] Inc[employer] ° °

## 2019-07-04 NOTE — Progress Notes (Signed)
Cory Hoffman - 43 y.o. male MRN [id number]  Date of birth: [birth year]  Subjective Chief Complaint  Patient presents with  . Gastroesophageal Reflux    pt said he has chest pain (2wks) generally after eating. dont think meds helping.    HPI Cory Hoffman is a 43 y.o. male with history of GERD here today with complaint of continued GERD symptoms and burning epigastric pain.  Reports pain occurs typically after eating.  This worsening started about 2 weeks ago.  He has been taking omeprazole regularly but it doesn't seem to be helping too much.  Denies frequent NSAID use.  He is a smoker.  He has decreased appetite and mild nausea from this.  He denies changes to stools including dark or bloody stools.   ROS:  A comprehensive ROS was completed and negative except as noted per HPI   No Known Allergies  Past Medical History:  Diagnosis Date  . Chronic headaches   . Frequent headaches   . HTN (hypertension)   . Hyperlipidemia   . Migraines     Past Surgical History:  Procedure Laterality Date  . BACK SURGERY    . KNEE ARTHROSCOPY    . WISDOM TOOTH EXTRACTION      Social History   Socioeconomic History  . Marital status: Married    Spouse name: Not on file  . Number of children: 4  . Years of education: 32  . Highest education level: Not on file  Occupational History  . Occupation: Air cabin crew  Social Needs  . Financial resource strain: Not on file  . Food insecurity    Worry: Not on file    Inability: Not on file  . Transportation needs    Medical: Not on file    Non-medical: Not on file  Tobacco Use  . Smoking status: Current Every Day Smoker    Packs/day: 1.00    Years: 15.00    Pack years: 15.00    Types: Cigarettes  . Smokeless tobacco: Never Used  Substance and Sexual Activity  . Alcohol use: Yes    Alcohol/week: 0.0 standard drinks    Comment: social/weekly  . Drug use: No  . Sexual activity: Yes  Lifestyle  . Physical activity    Days per  week: Not on file    Minutes per session: Not on file  . Stress: Not on file  Relationships  . Social Herbalist on phone: Not on file    Gets together: Not on file    Attends religious service: Not on file    Active member of club or organization: Not on file    Attends meetings of clubs or organizations: Not on file    Relationship status: Not on file  Other Topics Concern  . Not on file  Social History Narrative   Fun/Hobby: Racing, Designer, fashion/clothing.     Family History  Problem Relation Age of Onset  . Heart disease Mother   . Cancer - Lung Mother   . Heart disease Father   . Stroke Paternal Grandmother   . Heart attack Paternal Grandfather        Mid 74s    Health Maintenance  Topic Date Due  . HIV Screening  07/12/1993  . INFLUENZA VACCINE  11/21/2019 (Originally 03/24/2019)  . TETANUS/TDAP  03/07/2024    ----------------------------------------------------------------------------------------------------------------------------------------------------------------------------------------------------------------- Physical Exam BP 130/82   Pulse 71   Temp (!) 97.4 F (36.3 C) (Temporal)   Ht 5\' 10"  (  1.778 m)   Wt 206 lb 6.4 oz (93.6 kg)   SpO2 97%   BMI 29.62 kg/m   Physical Exam Constitutional:      Appearance: Normal appearance.  HENT:     Head: Normocephalic and atraumatic.  Eyes:     General: No scleral icterus. Cardiovascular:     Rate and Rhythm: Normal rate and regular rhythm.  Pulmonary:     Effort: Pulmonary effort is normal.     Breath sounds: Normal breath sounds.  Abdominal:     General: Abdomen is flat. There is no distension.     Palpations: Abdomen is soft.     Tenderness: There is no abdominal tenderness.  Skin:    General: Skin is dry.  Neurological:     General: No focal deficit present.     Mental Status: He is alert.  Psychiatric:        Mood and Affect: Mood normal.        Behavior: Behavior normal.      ------------------------------------------------------------------------------------------------------------------------------------------------------------------------------------------------------------------- Assessment and Plan  Acute gastritis without hemorrhage -Continue omeprazole.  Check H. Pylori breath test.  Advised to avoid all NSAIDS for now.   -discussed red flags including dark stools, increasing fatigue or dyspnea, or vomiting blood/coffee ground material.

## 2019-07-05 LAB — COMPREHENSIVE METABOLIC PANEL
ALT: 27 IU/L (ref 0–44)
AST: 20 IU/L (ref 0–40)
Albumin/Globulin Ratio: 2 (ref 1.2–2.2)
Albumin: 4.3 g/dL (ref 4.0–5.0)
Alkaline Phosphatase: 80 IU/L (ref 39–117)
BUN/Creatinine Ratio: 9 (ref 9–20)
BUN: 9 mg/dL (ref 6–24)
Bilirubin Total: 0.4 mg/dL (ref 0.0–1.2)
CO2: 23 mmol/L (ref 20–29)
Calcium: 9.1 mg/dL (ref 8.7–10.2)
Chloride: 107 mmol/L — ABNORMAL HIGH (ref 96–106)
Creatinine, Ser: 0.97 mg/dL (ref 0.76–1.27)
GFR calc Af Amer: 112 mL/min/{1.73_m2} (ref >59)
GFR calc non Af Amer: 97 mL/min/{1.73_m2} (ref >59)
Globulin, Total: 2.2 g/dL (ref 1.5–4.5)
Glucose: 97 mg/dL (ref 65–99)
Potassium: 4.4 mmol/L (ref 3.5–5.2)
Sodium: 142 mmol/L (ref 134–144)
Total Protein: 6.5 g/dL (ref 6.0–8.5)

## 2019-07-05 LAB — LIPID PANEL
Chol/HDL Ratio: 5.3 ratio — ABNORMAL HIGH (ref 0.0–5.0)
Cholesterol, Total: 168 mg/dL (ref 100–199)
HDL: 32 mg/dL — ABNORMAL LOW (ref >39)
LDL Chol Calc (NIH): 106 mg/dL — ABNORMAL HIGH (ref 0–99)
Triglycerides: 168 mg/dL — ABNORMAL HIGH (ref 0–149)
VLDL Cholesterol Cal: 30 mg/dL (ref 5–40)

## 2019-07-06 LAB — H. PYLORI BREATH TEST

## 2019-07-06 LAB — H.PYLORI BREATH TEST (REFLEX): H. pylori Breath Test: NEGATIVE

## 2019-07-10 NOTE — Progress Notes (Signed)
Cholesterol and have improved greatly.  Recommend that he continue on atorvastatin at current dose and follow a low fat/low cholesterol diet.

## 2019-07-10 NOTE — Progress Notes (Signed)
H .Pylori test is negative.  Recommend increasing omeprazole to twice daily for 2 weeks.  If still not improving let me know and we'll get appt with GI set up.

## 2019-08-29 ENCOUNTER — Other Ambulatory Visit: Payer: Self-pay | Admitting: Family Medicine

## 2019-08-29 DIAGNOSIS — K219 Gastro-esophageal reflux disease without esophagitis: Secondary | ICD-10-CM

## 2019-11-08 ENCOUNTER — Ambulatory Visit: Payer: BLUE CROSS/BLUE SHIELD | Admitting: Orthopaedic Surgery

## 2019-11-08 ENCOUNTER — Ambulatory Visit: Payer: Self-pay

## 2019-11-08 ENCOUNTER — Encounter: Payer: Self-pay | Admitting: Orthopaedic Surgery

## 2019-11-08 ENCOUNTER — Other Ambulatory Visit: Payer: Self-pay

## 2019-11-08 DIAGNOSIS — G8929 Other chronic pain: Secondary | ICD-10-CM

## 2019-11-08 DIAGNOSIS — M25562 Pain in left knee: Secondary | ICD-10-CM

## 2019-11-08 MED ORDER — METHYLPREDNISOLONE ACETATE 40 MG/ML IJ SUSP
40.0000 mg | INTRAMUSCULAR | Status: AC | PRN
  Administered 2019-11-08: 40 mg via INTRA_ARTICULAR

## 2019-11-08 MED ORDER — BUPIVACAINE HCL 0.5 % IJ SOLN
2.0000 mL | INTRAMUSCULAR | Status: AC | PRN
  Administered 2019-11-08: 2 mL via INTRA_ARTICULAR

## 2019-11-08 MED ORDER — LIDOCAINE HCL 1 % IJ SOLN
2.0000 mL | INTRAMUSCULAR | Status: AC | PRN
  Administered 2019-11-08: 2 mL

## 2019-11-08 NOTE — Progress Notes (Signed)
Office Visit Note   Patient: Cory Hoffman           Date of Birth: 1975-10-25           MRN: [id number] Visit Date: 11/08/2019              Requested by: Luetta Nutting, [address] [place],  [place] [address] PCP: Luetta Nutting, DO   Assessment & Plan: Visit Diagnoses:  1. Chronic pain of left knee     Plan: Impression is left knee pain likely due to osteoarthritis flareup versus degenerative meniscus tear.  Based on our discussion patient agreed to proceed with cortisone injection today.  He will take it easy over the next several weeks and if he does not improve he will let us know so that we can order an MRI to rule out structural abnormalities.  Questions encouraged and answered.  Follow-Up Instructions: Return if symptoms worsen or fail to improve.   Orders:  Orders Placed This Encounter  Procedures  . XR KNEE 3 VIEW LEFT   No orders of the defined types were placed in this encounter.     Procedures: Large Joint Inj: L knee on 11/08/2019 11:45 AM Details: 22 G needle Medications: 2 mL bupivacaine 0.5 %; 2 mL lidocaine 1 %; 40 mg methylPREDNISolone acetate 40 MG/ML Outcome: tolerated well, no immediate complications Patient was prepped and draped in the usual sterile fashion.       Clinical Data: No additional findings.   Subjective: Chief Complaint  Patient presents with  . Left Knee - Pain    Cory Hoffman is a 44 year old gentleman who comes in for evaluation of left knee pain that started insidiously 3 days ago without any known injuries.  He has pain that radiates posteriorly down the leg and occasionally the foot goes numb.  Denies any back pain or radicular symptoms.  Ibuprofen does not seem to help.  He has difficulty flexing his knee secondary to the pain.  He had a previous injection in July 2020 which did help quite a bit.  He denies any mechanical symptoms.   Review of Systems  Constitutional: Negative.   All other  systems reviewed and are negative.    Objective: Vital Signs: There were no vitals taken for this visit.  Physical Exam Vitals and nursing note reviewed.  Constitutional:      Appearance: He is well-developed.  Pulmonary:     Effort: Pulmonary effort is normal.  Abdominal:     Palpations: Abdomen is soft.  Skin:    General: Skin is warm.  Neurological:     Mental Status: He is alert and oriented to person, place, and time.  Psychiatric:        Behavior: Behavior normal.        Thought Content: Thought content normal.        Judgment: Judgment normal.     Ortho Exam Left knee exam shows trace joint effusion.  He has mild decreased range of motion secondary to moderate pain.  Collaterals and cruciates are stable.  No joint line tenderness. Specialty Comments:  No specialty comments available.  Imaging: XR KNEE 3 VIEW LEFT  Result Date: 11/08/2019 Mild joint space narrowing consistent with osteoarthritis.  No significant degenerative joint disease.    PMFS History: Patient Active Problem List   Diagnosis Date Noted  . Acute gastritis without hemorrhage 07/04/2019  . Influenza A 10/23/2018  . Dysfunction of both eustachian tubes  06/20/2018  . Arthritis of knee 03/10/2018  . Epigastric pain 03/10/2018  . Chronic pain of left knee 02/22/2018  . Nicotine dependence, cigarettes, uncomplicated 01/19/2018  . Insomnia 08/26/2017  . Anxiety 08/26/2017  . Hypercholesteremia 08/26/2017  . Migraine without status migrainosus, not intractable 08/26/2017  . Essential hypertension 03/14/2017  . Encounter for smoking cessation counseling 04/07/2016  . OSA (obstructive sleep apnea) 09/24/2015  . Vitamin D deficiency 05/26/2015  . Gastroesophageal reflux disease without esophagitis 05/11/2015  . Obesity (BMI 30-39.9) 05/11/2015  . Family history of premature CAD 04/07/2015   Past Medical History:  Diagnosis Date  . Chronic headaches   . Frequent headaches   . HTN  (hypertension)   . Hyperlipidemia   . Migraines     Family History  Problem Relation Age of Onset  . Heart disease Mother   . Cancer - Lung Mother   . Heart disease Father   . Stroke Paternal Grandmother   . Heart attack Paternal Grandfather        Mid 73s    Past Surgical History:  Procedure Laterality Date  . BACK SURGERY    . KNEE ARTHROSCOPY    . WISDOM TOOTH EXTRACTION     Social History   Occupational History  . Occupation: Data processing manager  Tobacco Use  . Smoking status: Current Every Day Smoker    Packs/day: 1.00    Years: 15.00    Pack years: 15.00    Types: Cigarettes  . Smokeless tobacco: Never Used  Substance and Sexual Activity  . Alcohol use: Yes    Alcohol/week: 0.0 standard drinks    Comment: social/weekly  . Drug use: No  . Sexual activity: Yes

## 2019-11-19 ENCOUNTER — Other Ambulatory Visit: Payer: Self-pay | Admitting: Family Medicine

## 2019-11-19 DIAGNOSIS — E7800 Pure hypercholesterolemia, unspecified: Secondary | ICD-10-CM

## 2019-11-21 ENCOUNTER — Other Ambulatory Visit: Payer: Self-pay | Admitting: Family Medicine

## 2019-11-21 DIAGNOSIS — K219 Gastro-esophageal reflux disease without esophagitis: Secondary | ICD-10-CM

## 2019-11-21 NOTE — Telephone Encounter (Signed)
Patient needing follow up before refill per Dr. Ashley Royalty. Appointment scheduled.

## 2019-11-26 ENCOUNTER — Other Ambulatory Visit: Payer: Self-pay

## 2019-11-26 ENCOUNTER — Telehealth: Payer: Self-pay | Admitting: Family Medicine

## 2019-11-26 DIAGNOSIS — G4700 Insomnia, unspecified: Secondary | ICD-10-CM

## 2019-11-26 MED ORDER — ZOLPIDEM TARTRATE 10 MG PO TABS: ORAL_TABLET | ORAL | 5 refills | Status: DC

## 2019-11-26 NOTE — Telephone Encounter (Signed)
Pt has appt for tomorrow and is prescreened, Pt asked if there was anyway his zolpidem (AMBIEN) 10 MG tablet could be filled today so he actually sleep tonight, He is completely out and said he wasn't able to sleep last night

## 2019-11-27 ENCOUNTER — Ambulatory Visit: Payer: BLUE CROSS/BLUE SHIELD | Admitting: Family Medicine

## 2019-11-27 ENCOUNTER — Encounter: Payer: Self-pay | Admitting: Family Medicine

## 2019-11-27 VITALS — BP 120/78 | HR 93 | Temp 97.4°F | Ht 70.0 in | Wt 210.4 lb

## 2019-11-27 DIAGNOSIS — E7800 Pure hypercholesterolemia, unspecified: Secondary | ICD-10-CM

## 2019-11-27 DIAGNOSIS — G4700 Insomnia, unspecified: Secondary | ICD-10-CM

## 2019-11-27 DIAGNOSIS — F17210 Nicotine dependence, cigarettes, uncomplicated: Secondary | ICD-10-CM

## 2019-11-27 NOTE — Patient Instructions (Addendum)
Steps to Quit Smoking Smoking tobacco is the leading cause of preventable death. It can affect almost every organ in the body. Smoking puts you and those around you at risk for developing many serious chronic diseases. Quitting smoking can be difficult, but it is one of the best things that you can do for your health. It is never too late to quit. How do I get ready to quit? When you decide to quit smoking, create a plan to help you succeed. Before you quit:  Pick a date to quit. Set a date within the next 2 weeks to give you time to prepare.  Write down the reasons why you are quitting. Keep this list in places where you will see it often.  Tell your family, friends, and co-workers that you are quitting. Support from your loved ones can make quitting easier.  Talk with your health care provider about your options for quitting smoking.  Find out what treatment options are covered by your health insurance.  Identify people, places, things, and activities that make you want to smoke (triggers). Avoid them. What first steps can I take to quit smoking?  Throw away all cigarettes at home, at work, and in your car.  Throw away smoking accessories, such as ashtrays and lighters.  Clean your car. Make sure to empty the ashtray.  Clean your home, including curtains and carpets. What strategies can I use to quit smoking? Talk with your health care provider about combining strategies, such as taking medicines while you are also receiving in-person counseling. Using these two strategies together makes you more likely to succeed in quitting than if you used either strategy on its own.  If you are pregnant or breastfeeding, talk with your health care provider about finding counseling or other support strategies to quit smoking. Do not take medicine to help you quit smoking unless your health care provider tells you to do so. To quit smoking: Quit right away  Quit smoking completely, instead of  gradually reducing how much you smoke over a period of time. Research shows that stopping smoking right away is more successful than gradually quitting.  Attend in-person counseling to help you build problem-solving skills. You are more likely to succeed in quitting if you attend counseling sessions regularly. Even short sessions of 10 minutes can be effective. Take medicine You may take medicines to help you quit smoking. Some medicines require a prescription and some you can purchase over-the-counter. Medicines may have nicotine in them to replace the nicotine in cigarettes. Medicines may:  Help to stop cravings.  Help to relieve withdrawal symptoms. Your health care provider may recommend:  Nicotine patches, gum, or lozenges.  Nicotine inhalers or sprays.  Non-nicotine medicine that is taken by mouth. Find resources Find resources and support systems that can help you to quit smoking and remain smoke-free after you quit. These resources are most helpful when you use them often. They include:  Online chats with a counselor.  Telephone quitlines.  Printed self-help materials.  Support groups or group counseling.  Text messaging programs.  Mobile phone apps or applications. Use apps that can help you stick to your quit plan by providing reminders, tips, and encouragement. There are many free apps for mobile devices as well as websites. Examples include Quit Guide from the CDC and smokefree.gov What things can I do to make it easier to quit?   Reach out to your family and friends for support and encouragement. Call telephone quitlines (1-800-QUIT-NOW), reach   out to support groups, or work with a Veterinary surgeon for support.  Ask people who smoke to avoid smoking around you.  Avoid places that trigger you to smoke, such as bars, parties, or smoke-break areas at work.  Spend time with people who do not smoke.  Lessen the stress in your life. Stress can be a smoking trigger for some  people. To lessen stress, try: ? Exercising regularly. ? Doing deep-breathing exercises. ? Doing yoga. ? Meditating. ? Performing a body scan. This involves closing your eyes, scanning your body from head to toe, and noticing which parts of your body are particularly tense. Try to relax the muscles in those areas. How will I feel when I quit smoking? Day 1 to 3 weeks Within the first 24 hours of quitting smoking, you may start to feel withdrawal symptoms. These symptoms are usually most noticeable 2-3 days after quitting, but they usually do not last for more than 2-3 weeks. You may experience these symptoms:  Mood swings.  Restlessness, anxiety, or irritability.  Trouble concentrating.  Dizziness.  Strong cravings for sugary foods and nicotine.  Mild weight gain.  Constipation.  Nausea.  Coughing or a sore throat.  Changes in how the medicines that you take for unrelated issues work in your body.  Depression.  Trouble sleeping (insomnia). Week 3 and afterward After the first 2-3 weeks of quitting, you may start to notice more positive results, such as:  Improved sense of smell and taste.  Decreased coughing and sore throat.  Slower heart rate.  Lower blood pressure.  Clearer skin.  The ability to breathe more easily.  Fewer sick days. Quitting smoking can be very challenging. Do not get discouraged if you are not successful the first time. Some people need to make many attempts to quit before they achieve long-term success. Do your best to stick to your quit plan, and talk with your health care provider if you have any questions or concerns. Summary  Smoking tobacco is the leading cause of preventable death. Quitting smoking is one of the best things that you can do for your health.  When you decide to quit smoking, create a plan to help you succeed.  Quit smoking right away, not slowly over a period of time.  When you start quitting, seek help from your  health care provider, family, or friends. This information is not intended to replace advice given to you by your health care provider. Make sure you discuss any questions you have with your health care provider. Document Revised: 05/04/2019 Document Reviewed: 10/28/2018 Elsevier Patient Education  2020 [employer]  Preventing High Cholesterol Cholesterol is a white, waxy substance similar to fat that the human body needs to help build cells. The liver makes all the cholesterol that a person's body needs. Having high cholesterol (hypercholesterolemia) increases a person's risk for heart disease and stroke. Extra (excess) cholesterol comes from the food the person eats. High cholesterol can often be prevented with diet and lifestyle changes. If you already have high cholesterol, you can control it with diet and lifestyle changes and with medicine. How can high cholesterol affect me? If you have high cholesterol, deposits (plaques) may build up on the walls of your arteries. The arteries are the blood vessels that carry blood away from your heart. Plaques make the arteries narrower and stiffer. This can limit or block blood flow and cause blood clots to form. Blood clots:  Are tiny balls of cells that form in your blood.  Can move to the heart or brain, causing a heart attack or stroke. Plaques in arteries greatly increase your risk for heart attack and stroke.Making diet and lifestyle changes can reduce your risk for these conditions that may threaten your life. What can increase my risk? This condition is more likely to develop in people who:  Eat foods that are high in saturated fat or cholesterol. Saturated fat is mostly found in: ? Foods that contain animal fat, such as red meat and some dairy products. ? Certain fatty foods made from plants, such as tropical oils.  Are overweight.  Are not getting enough exercise.  Have a family history of high cholesterol. What actions can I take  to prevent this? Nutrition   Eat less saturated fat.  Avoid trans fats (partially hydrogenated oils). These are often found in margarine and in some baked goods, fried foods, and snacks bought in packages.  Avoid precooked or cured meat, such as sausages or meat loaves.  Avoid foods and drinks that have added sugars.  Eat more fruits, vegetables, and whole grains.  Choose healthy sources of protein, such as fish, poultry, lean cuts of red meat, beans, peas, lentils, and nuts.  Choose healthy sources of fat, such as: ? Nuts. ? Vegetable oils, especially olive oil. ? Fish that have healthy fats (omega-3 fatty acids), such as mackerel or salmon. The items listed above may not be a complete list of recommended foods and beverages. Contact a dietitian for more information. Lifestyle  Lose weight if you are overweight. Losing 5-10 lb (2.3-4.5 kg) can help prevent or control high cholesterol. It can also lower your risk for diabetes and high blood pressure. Ask your health care provider to help you with a diet and exercise plan to lose weight safely.  Do not use any products that contain nicotine or tobacco, such as cigarettes, e-cigarettes, and chewing tobacco. If you need help quitting, ask your health care provider.  Limit your alcohol intake. ? Do not drink alcohol if:  Your health care provider tells you not to drink.  You are pregnant, may be pregnant, or are planning to become pregnant. ? If you drink alcohol:  Limit how much you use to:  0-1 drink a day for women.  0-2 drinks a day for men.  Be aware of how much alcohol is in your drink. In the [place] one drink equals one 12 oz bottle of beer (355 mL), one 5 oz glass of wine (148 mL), or one 1 oz glass of hard liquor (44 mL). Activity   Get enough exercise. Each week, do at least 150 minutes of exercise that takes a medium level of effort (moderate-intensity exercise). ? This is exercise that:  Makes your heart beat  faster and makes you breathe harder than usual.  Allows you to still be able to talk. ? You could exercise in short sessions several times a day or longer sessions a few times a week. For example, on 5 days each week, you could walk fast or ride your bike 3 times a day for 10 minutes each time.  Do exercises as told by your health care provider. Medicines  In addition to diet and lifestyle changes, your health care provider may recommend medicines to help lower cholesterol. This may be a medicine to lower the amount of cholesterol your liver makes. You may need medicine if: ? Diet and lifestyle changes do not lower your cholesterol enough. ? You have high cholesterol and other risk  factors for heart disease or stroke.  Take over-the-counter and prescription medicines only as told by your health care provider. General information  Manage your risk factors for high cholesterol. Talk with your health care provider about all your risk factors and how to lower your risk.  Manage other conditions that you have, such as diabetes or high blood pressure (hypertension).  Have blood tests to check your cholesterol levels at regular points in time as told by your health care provider.  Keep all follow-up visits as told by your health care provider. This is important. Where to find more information  American Heart Association: www.heart.org  National Heart, Lung, and Blood Institute: [employer].iswww.nhlbi.nih.gov Summary  High cholesterol increases your risk for heart disease and stroke. By keeping your cholesterol level low, you can reduce your risk for these conditions.  High cholesterol can often be prevented with diet and lifestyle changes.  Work with your health care provider to manage your risk factors, and have your blood tested regularly. This information is not intended to replace advice given to you by your health care provider. Make sure you discuss any questions you have with your health care  provider. Document Revised: 12/01/2018 Document Reviewed: 04/17/2016 Elsevier Patient Education  2020 [employer] Inc.  Insomnia Insomnia is a sleep disorder that makes it difficult to fall asleep or stay asleep. Insomnia can cause fatigue, low energy, difficulty concentrating, mood swings, and poor performance at work or school. There are three different ways to classify insomnia:  Difficulty falling asleep.  Difficulty staying asleep.  Waking up too early in the morning. Any type of insomnia can be long-term (chronic) or short-term (acute). Both are common. Short-term insomnia usually lasts for three months or less. Chronic insomnia occurs at least three times a week for longer than three months. What are the causes? Insomnia may be caused by another condition, situation, or substance, such as:  Anxiety.  Certain medicines.  Gastroesophageal reflux disease (GERD) or other gastrointestinal conditions.  Asthma or other breathing conditions.  Restless legs syndrome, sleep apnea, or other sleep disorders.  Chronic pain.  Menopause.  Stroke.  Abuse of alcohol, tobacco, or illegal drugs.  Mental health conditions, such as depression.  Caffeine.  Neurological disorders, such as Alzheimer's disease.  An overactive thyroid (hyperthyroidism). Sometimes, the cause of insomnia may not be known. What increases the risk? Risk factors for insomnia include:  Gender. Women are affected more often than men.  Age. Insomnia is more common as you get older.  Stress.  Lack of exercise.  Irregular work schedule or working night shifts.  Traveling between different time zones.  Certain medical and mental health conditions. What are the signs or symptoms? If you have insomnia, the main symptom is having trouble falling asleep or having trouble staying asleep. This may lead to other symptoms, such as:  Feeling fatigued or having low energy.  Feeling nervous about going to  sleep.  Not feeling rested in the morning.  Having trouble concentrating.  Feeling irritable, anxious, or depressed. How is this diagnosed? This condition may be diagnosed based on:  Your symptoms and medical history. Your health care provider may ask about: ? Your sleep habits. ? Any medical conditions you have. ? Your mental health.  A physical exam. How is this treated? Treatment for insomnia depends on the cause. Treatment may focus on treating an underlying condition that is causing insomnia. Treatment may also include:  Medicines to help you sleep.  Counseling or therapy.  Lifestyle  adjustments to help you sleep better. Follow these instructions at home: Eating and drinking   Limit or avoid alcohol, caffeinated beverages, and cigarettes, especially close to bedtime. These can disrupt your sleep.  Do not eat a large meal or eat spicy foods right before bedtime. This can lead to digestive discomfort that can make it hard for you to sleep. Sleep habits   Keep a sleep diary to help you and your health care provider figure out what could be causing your insomnia. Write down: ? When you sleep. ? When you wake up during the night. ? How well you sleep. ? How rested you feel the next day. ? Any side effects of medicines you are taking. ? What you eat and drink.  Make your bedroom a dark, comfortable place where it is easy to fall asleep. ? Put up shades or blackout curtains to block light from outside. ? Use a white noise machine to block noise. ? Keep the temperature cool.  Limit screen use before bedtime. This includes: ? Watching TV. ? Using your smartphone, tablet, or computer.  Stick to a routine that includes going to bed and waking up at the same times every day and night. This can help you fall asleep faster. Consider making a quiet activity, such as reading, part of your nighttime routine.  Try to avoid taking naps during the day so that you sleep better at  night.  Get out of bed if you are still awake after 15 minutes of trying to sleep. Keep the lights down, but try reading or doing a quiet activity. When you feel sleepy, go back to bed. General instructions  Take over-the-counter and prescription medicines only as told by your health care provider.  Exercise regularly, as told by your health care provider. Avoid exercise starting several hours before bedtime.  Use relaxation techniques to manage stress. Ask your health care provider to suggest some techniques that may work well for you. These may include: ? Breathing exercises. ? Routines to release muscle tension. ? Visualizing peaceful scenes.  Make sure that you drive carefully. Avoid driving if you feel very sleepy.  Keep all follow-up visits as told by your health care provider. This is important. Contact a health care provider if:  You are tired throughout the day.  You have trouble in your daily routine due to sleepiness.  You continue to have sleep problems, or your sleep problems get worse. Get help right away if:  You have serious thoughts about hurting yourself or someone else. If you ever feel like you may hurt yourself or others, or have thoughts about taking your own life, get help right away. You can go to your nearest emergency department or call:  Your local emergency services (911 in the [place]  A suicide crisis helpline, such as the National Suicide Prevention Lifeline at (510)574-7881. This is open 24 hours a day. Summary  Insomnia is a sleep disorder that makes it difficult to fall asleep or stay asleep.  Insomnia can be long-term (chronic) or short-term (acute).  Treatment for insomnia depends on the cause. Treatment may focus on treating an underlying condition that is causing insomnia.  Keep a sleep diary to help you and your health care provider figure out what could be causing your insomnia. This information is not intended to replace advice given  to you by your health care provider. Make sure you discuss any questions you have with your health care provider. Document Revised: 07/22/2017 Document Reviewed:  05/19/2017 Elsevier Patient Education  2020 [employer].

## 2019-11-27 NOTE — Progress Notes (Signed)
Established Patient Office Visit  Subjective:  Patient ID: Cory Hoffman, male    DOB: September 28, 1975  Age: 44 y.o. MRN: [id number]  CC:  Chief Complaint  Patient presents with  . Follow-up    refill/follow up on medications.     HPI Cory Hoffman presents for follow-up of his chronic insomnia, elevated cholesterol and tobacco abuse.  Longstanding history of chronic insomnia that has been treated successfully with Ambien taken nightly over the last 10 years.  Denies sleep aberration with this.  History of elevated cholesterol treated with atorvastatin.  Last LDL cholesterol was 106.  He has been taking his atorvastatin daily.  He has no history of a vascular event today.  Longstanding history of tobacco abuse.  He has quit in the past.  He is taking Chantix in the past with ill effect.  History of hypertension that has been treated successfully with lifestyle modification he tells me.  He drinks intermittently.  He had 3 or 4 mixed drinks over 10-hour.  This past Saturday.  He tells me that he may not drink again for 6 weeks.  He has a history of chronic knee pain associated with arthritis.  Past Medical History:  Diagnosis Date  . Chronic headaches   . Frequent headaches   . HTN (hypertension)   . Hyperlipidemia   . Migraines     Past Surgical History:  Procedure Laterality Date  . BACK SURGERY    . KNEE ARTHROSCOPY    . WISDOM TOOTH EXTRACTION      Family History  Problem Relation Age of Onset  . Heart disease Mother   . Cancer - Lung Mother   . Heart disease Father   . Stroke Paternal Grandmother   . Heart attack Paternal Grandfather        Mid 46s    Social History   Socioeconomic History  . Marital status: Married    Spouse name: Not on file  . Number of children: 4  . Years of education: 30  . Highest education level: Not on file  Occupational History  . Occupation: Data processing manager  Tobacco Use  . Smoking status: Current Every Day Smoker    Packs/day: 1.00     Years: 15.00    Pack years: 15.00    Types: Cigarettes  . Smokeless tobacco: Never Used  Substance and Sexual Activity  . Alcohol use: Yes    Alcohol/week: 0.0 standard drinks    Comment: up to 3-4 drinks in a given setting sporadically.  . Drug use: No  . Sexual activity: Yes  Other Topics Concern  . Not on file  Social History Narrative   Fun/Hobby: Racing, Photographer.    Social Determinants of Health   Financial Resource Strain:   . Difficulty of Paying Living Expenses:   Food Insecurity:   . Worried About Programme researcher, broadcasting/film/video in the Last Year:   . Barista in the Last Year:   Transportation Needs:   . Freight forwarder (Medical):   Marland Kitchen Lack of Transportation (Non-Medical):   Physical Activity:   . Days of Exercise per Week:   . Minutes of Exercise per Session:   Stress:   . Feeling of Stress :   Social Connections:   . Frequency of Communication with Friends and Family:   . Frequency of Social Gatherings with Friends and Family:   . Attends Religious Services:   . Active Member of Clubs or Organizations:   .  Attends Banker Meetings:   Marland Kitchen Marital Status:   Intimate Partner Violence:   . Fear of Current or Ex-Partner:   . Emotionally Abused:   Marland Kitchen Physically Abused:   . Sexually Abused:     Outpatient Medications Prior to Visit  Medication Sig Dispense Refill  . atorvastatin (LIPITOR) 40 MG tablet TAKE 1 TABLET(40 MG) BY MOUTH DAILY 90 tablet 0  . omeprazole (PRILOSEC) 20 MG capsule TAKE 1 CAPSULE (20 MG TOTAL) BY MOUTH DAILY. MUST KEEP SCHEDULED APPT W/NEW PROVIDER FOR REFILLS 90 capsule 0  . zolpidem (AMBIEN) 10 MG tablet TAKE 1 TABLET BY MOUTH EVERY DAY AT BEDTIME AS NEEDED FOR SLEEP 30 tablet 5   No facility-administered medications prior to visit.    No Known Allergies  ROS Review of Systems  Constitutional: Negative.   HENT: Negative.   Eyes: Negative for photophobia and visual disturbance.  Respiratory: Negative.     Cardiovascular: Negative.   Gastrointestinal: Negative.   Endocrine: Negative for polyphagia and polyuria.  Genitourinary: Negative.   Musculoskeletal: Positive for arthralgias.  Neurological: Negative for weakness.  Psychiatric/Behavioral: Positive for sleep disturbance. Negative for dysphoric mood. The patient is not nervous/anxious.    Depression screen [facility] 2/9 11/27/2019 05/31/2019 01/19/2018  Decreased Interest 0 0 0  Down, Depressed, Hopeless 0 0 0  PHQ - 2 Score 0 0 0  Altered sleeping 3 - -  Tired, decreased energy 0 - -  Change in appetite 0 - -  Feeling bad or failure about yourself  0 - -  Trouble concentrating 0 - -  Moving slowly or fidgety/restless 0 - -  Suicidal thoughts 0 - -  PHQ-9 Score 3 - -  Difficult doing work/chores Not difficult at all - -      Objective:    Physical Exam  Constitutional: He is oriented to person, place, and time. He appears well-developed and well-nourished. No distress.  HENT:  Head: Normocephalic and atraumatic.  Right Ear: External ear normal.  Left Ear: External ear normal.  Eyes: Conjunctivae are normal. Right eye exhibits no discharge. Left eye exhibits no discharge. No scleral icterus.  Neck: No JVD present. No tracheal deviation present. No thyromegaly present.  Cardiovascular: Normal rate, regular rhythm and normal heart sounds.  Pulmonary/Chest: Effort normal and breath sounds normal. No stridor. No respiratory distress. He has no wheezes. He has no rales.  Lymphadenopathy:    He has no cervical adenopathy.  Neurological: He is alert and oriented to person, place, and time.  Skin: Skin is warm and dry. He is not diaphoretic.  Psychiatric: He has a normal mood and affect.    BP 120/78   Pulse 93   Temp (!) 97.4 F (36.3 C) (Tympanic)   Ht 5\' 10"  (1.778 m)   Wt 210 lb 6.4 oz (95.4 kg)   SpO2 95%   BMI 30.19 kg/m  Wt Readings from Last 3 Encounters:  11/27/19 210 lb 6.4 oz (95.4 kg)  07/04/19 206 lb 6.4 oz (93.6 kg)   05/31/19 206 lb (93.4 kg)     Health Maintenance Due  Topic Date Due  . HIV Screening  Never done    There are no preventive care reminders to display for this patient.  No results found for: TSH Lab Results  Component Value Date   WBC 9.2 03/10/2018   HGB 15.6 03/10/2018   HCT 45.9 03/10/2018   MCV 86.6 03/10/2018   PLT 260.0 03/10/2018   Lab Results  Component  Value Date   NA 142 07/04/2019   K 4.4 07/04/2019   CO2 23 07/04/2019   GLUCOSE 97 07/04/2019   BUN 9 07/04/2019   CREATININE 0.97 07/04/2019   BILITOT 0.4 07/04/2019   ALKPHOS 80 07/04/2019   AST 20 07/04/2019   ALT 27 07/04/2019   PROT 6.5 07/04/2019   ALBUMIN 4.3 07/04/2019   CALCIUM 9.1 07/04/2019   GFR 81.50 03/10/2018   Lab Results  Component Value Date   CHOL 168 07/04/2019   Lab Results  Component Value Date   HDL 32 (L) 07/04/2019   Lab Results  Component Value Date   LDLCALC 106 (H) 07/04/2019   Lab Results  Component Value Date   TRIG 168 (H) 07/04/2019   Lab Results  Component Value Date   CHOLHDL 5.3 (H) 07/04/2019   No results found for: HGBA1C    Assessment & Plan:   Problem List Items Addressed This Visit      Other   Insomnia - Primary   Hypercholesteremia   Relevant Orders   LDL cholesterol, direct   Comprehensive metabolic panel   Lipid panel   CBC   Nicotine dependence, cigarettes, uncomplicated      No orders of the defined types were placed in this encounter.   Follow-up: Return in about 6 months (around 05/28/2020).  Patient was given information on sleep hygiene, preventing high cholesterol and steps to quit smoking.  I expressed my concern about his risk for vascular disease regarding his tobacco history, elevated cholesterol and chronic insomnia.  Quitting smoking is the 1 thing he could do to greatly mitigate that risk.  Discussed the risks of long-term Ambien use to include memory loss and risk of falls.   Libby Maw, MD

## 2020-02-17 ENCOUNTER — Other Ambulatory Visit: Payer: Self-pay | Admitting: Family Medicine

## 2020-02-17 DIAGNOSIS — E7800 Pure hypercholesterolemia, unspecified: Secondary | ICD-10-CM

## 2020-03-04 ENCOUNTER — Other Ambulatory Visit: Payer: Self-pay | Admitting: Family Medicine

## 2020-03-04 DIAGNOSIS — K219 Gastro-esophageal reflux disease without esophagitis: Secondary | ICD-10-CM

## 2020-03-05 ENCOUNTER — Encounter: Payer: Self-pay | Admitting: Family Medicine

## 2020-03-05 DIAGNOSIS — K219 Gastro-esophageal reflux disease without esophagitis: Secondary | ICD-10-CM

## 2020-03-06 MED ORDER — PANTOPRAZOLE SODIUM 20 MG PO TBEC: 20.0000 mg | DELAYED_RELEASE_TABLET | Freq: Every day | ORAL | 1 refills | Status: DC

## 2020-03-06 NOTE — Telephone Encounter (Signed)
Changed to protonix

## 2020-03-13 ENCOUNTER — Telehealth: Payer: Self-pay

## 2020-03-13 NOTE — Telephone Encounter (Signed)
Key: [id number]  Previous PPI listed in chart was omeprazole. OTC tried and failed listed in chart if Tums.   No allergies listed for formulary alternatives and no contraindication listed in patients chart.   PA has been submitted and awaiting clinical determination.

## 2020-03-17 NOTE — Telephone Encounter (Signed)
PA came back denied. REASON: Pt is able to purchase PPI (omperazole/Prilosec or esomeprazole/Nexium)  OTC.  I have sent a mychart message to patient informing of the same.

## 2020-05-20 ENCOUNTER — Other Ambulatory Visit: Payer: Self-pay | Admitting: Family Medicine

## 2020-05-20 DIAGNOSIS — G4700 Insomnia, unspecified: Secondary | ICD-10-CM

## 2020-05-20 DIAGNOSIS — E7800 Pure hypercholesterolemia, unspecified: Secondary | ICD-10-CM

## 2020-05-29 ENCOUNTER — Other Ambulatory Visit: Payer: Self-pay | Admitting: Family Medicine

## 2020-05-29 DIAGNOSIS — K219 Gastro-esophageal reflux disease without esophagitis: Secondary | ICD-10-CM

## 2020-06-24 ENCOUNTER — Other Ambulatory Visit: Payer: Self-pay

## 2020-06-25 ENCOUNTER — Encounter: Payer: Self-pay | Admitting: Family Medicine

## 2020-06-25 ENCOUNTER — Ambulatory Visit: Payer: BLUE CROSS/BLUE SHIELD | Admitting: Family Medicine

## 2020-06-25 VITALS — BP 124/82 | HR 84 | Temp 97.8°F | Ht 70.0 in | Wt 207.8 lb

## 2020-06-25 DIAGNOSIS — I10 Essential (primary) hypertension: Secondary | ICD-10-CM | POA: Diagnosis not present

## 2020-06-25 NOTE — Patient Instructions (Signed)
Check your blood pressure 3-4x/wk x 2 wks  Send mychart message or call with readings in 2 wks Goal is under 140/ under 90 for BP average Limit sodium Increase water intake   DASH Eating Plan DASH stands for "Dietary Approaches to Stop Hypertension." The DASH eating plan is a healthy eating plan that has been shown to reduce high blood pressure (hypertension). It may also reduce your risk for type 2 diabetes, heart disease, and stroke. The DASH eating plan may also help with weight loss. What are tips for following this plan?  General guidelines  Avoid eating more than 2,300 mg (milligrams) of salt (sodium) a day. If you have hypertension, you may need to reduce your sodium intake to 1,500 mg a day.  Limit alcohol intake to no more than 1 drink a day for nonpregnant women and 2 drinks a day for men. One drink equals 12 oz of beer, 5 oz of wine, or 1 oz of hard liquor.  Work with your health care provider to maintain a healthy body weight or to lose weight. Ask what an ideal weight is for you.  Get at least 30 minutes of exercise that causes your heart to beat faster (aerobic exercise) most days of the week. Activities may include walking, swimming, or biking.  Work with your health care provider or diet and nutrition specialist (dietitian) to adjust your eating plan to your individual calorie needs. Reading food labels   Check food labels for the amount of sodium per serving. Choose foods with less than 5 percent of the Daily Value of sodium. Generally, foods with less than 300 mg of sodium per serving fit into this eating plan.  To find whole grains, look for the word "whole" as the first word in the ingredient list. Shopping  Buy products labeled as "low-sodium" or "no salt added."  Buy fresh foods. Avoid canned foods and premade or frozen meals. Cooking  Avoid adding salt when cooking. Use salt-free seasonings or herbs instead of table salt or sea salt. Check with your health  care provider or pharmacist before using salt substitutes.  Do not fry foods. Cook foods using healthy methods such as baking, boiling, grilling, and broiling instead.  Cook with heart-healthy oils, such as olive, canola, soybean, or sunflower oil. Meal planning  Eat a balanced diet that includes: ? 5 or more servings of fruits and vegetables each day. At each meal, try to fill half of your plate with fruits and vegetables. ? Up to 6-8 servings of whole grains each day. ? Less than 6 oz of lean meat, poultry, or fish each day. A 3-oz serving of meat is about the same size as a deck of cards. One egg equals 1 oz. ? 2 servings of low-fat dairy each day. ? A serving of nuts, seeds, or beans 5 times each week. ? Heart-healthy fats. Healthy fats called Omega-3 fatty acids are found in foods such as flaxseeds and coldwater fish, like sardines, salmon, and mackerel.  Limit how much you eat of the following: ? Canned or prepackaged foods. ? Food that is high in trans fat, such as fried foods. ? Food that is high in saturated fat, such as fatty meat. ? Sweets, desserts, sugary drinks, and other foods with added sugar. ? Full-fat dairy products.  Do not salt foods before eating.  Try to eat at least 2 vegetarian meals each week.  Eat more home-cooked food and less restaurant, buffet, and fast food.  When eating  at a restaurant, ask that your food be prepared with less salt or no salt, if possible. What foods are recommended? The items listed may not be a complete list. Talk with your dietitian about what dietary choices are best for you. Grains Whole-grain or whole-wheat bread. Whole-grain or whole-wheat pasta. Brown rice. Orpah Cobb. Bulgur. Whole-grain and low-sodium cereals. Pita bread. Low-fat, low-sodium crackers. Whole-wheat flour tortillas. Vegetables Fresh or frozen vegetables (raw, steamed, roasted, or grilled). Low-sodium or reduced-sodium tomato and vegetable juice. Low-sodium  or reduced-sodium tomato sauce and tomato paste. Low-sodium or reduced-sodium canned vegetables. Fruits All fresh, dried, or frozen fruit. Canned fruit in natural juice (without added sugar). Meat and other protein foods Skinless chicken or [place]. Ground chicken or [place]. Pork with fat trimmed off. Fish and seafood. Egg whites. Dried beans, peas, or lentils. Unsalted nuts, nut butters, and seeds. Unsalted canned beans. Lean cuts of beef with fat trimmed off. Low-sodium, lean deli meat. Dairy Low-fat (1%) or fat-free (skim) milk. Fat-free, low-fat, or reduced-fat cheeses. Nonfat, low-sodium ricotta or cottage cheese. Low-fat or nonfat yogurt. Low-fat, low-sodium cheese. Fats and oils Soft margarine without trans fats. Vegetable oil. Low-fat, reduced-fat, or light mayonnaise and salad dressings (reduced-sodium). Canola, safflower, olive, soybean, and sunflower oils. Avocado. Seasoning and other foods Herbs. Spices. Seasoning mixes without salt. Unsalted popcorn and pretzels. Fat-free sweets. What foods are not recommended? The items listed may not be a complete list. Talk with your dietitian about what dietary choices are best for you. Grains Baked goods made with fat, such as croissants, muffins, or some breads. Dry pasta or rice meal packs. Vegetables Creamed or fried vegetables. Vegetables in a cheese sauce. Regular canned vegetables (not low-sodium or reduced-sodium). Regular canned tomato sauce and paste (not low-sodium or reduced-sodium). Regular tomato and vegetable juice (not low-sodium or reduced-sodium). Rosita Fire. Olives. Fruits Canned fruit in a light or heavy syrup. Fried fruit. Fruit in cream or butter sauce. Meat and other protein foods Fatty cuts of meat. Ribs. Fried meat. Tomasa Blase. Sausage. Bologna and other processed lunch meats. Salami. Fatback. Hotdogs. Bratwurst. Salted nuts and seeds. Canned beans with added salt. Canned or smoked fish. Whole eggs or egg yolks. Chicken or [place]  with skin. Dairy Whole or 2% milk, cream, and half-and-half. Whole or full-fat cream cheese. Whole-fat or sweetened yogurt. Full-fat cheese. Nondairy creamers. Whipped toppings. Processed cheese and cheese spreads. Fats and oils Butter. Stick margarine. Lard. Shortening. Ghee. Bacon fat. Tropical oils, such as coconut, palm kernel, or palm oil. Seasoning and other foods Salted popcorn and pretzels. Onion salt, garlic salt, seasoned salt, table salt, and sea salt. Worcestershire sauce. Tartar sauce. Barbecue sauce. Teriyaki sauce. Soy sauce, including reduced-sodium. Steak sauce. Canned and packaged gravies. Fish sauce. Oyster sauce. Cocktail sauce. Horseradish that you find on the shelf. Ketchup. Mustard. Meat flavorings and tenderizers. Bouillon cubes. Hot sauce and Tabasco sauce. Premade or packaged marinades. Premade or packaged taco seasonings. Relishes. Regular salad dressings. Where to find more information:  National Heart, Lung, and Blood Institute: PopSteam.is  American Heart Association: www.heart.org Summary  The DASH eating plan is a healthy eating plan that has been shown to reduce high blood pressure (hypertension). It may also reduce your risk for type 2 diabetes, heart disease, and stroke.  With the DASH eating plan, you should limit salt (sodium) intake to 2,300 mg a day. If you have hypertension, you may need to reduce your sodium intake to 1,500 mg a day.  When on the DASH eating plan, aim to  eat more fresh fruits and vegetables, whole grains, lean proteins, low-fat dairy, and heart-healthy fats.  Work with your health care provider or diet and nutrition specialist (dietitian) to adjust your eating plan to your individual calorie needs. This information is not intended to replace advice given to you by your health care provider. Make sure you discuss any questions you have with your health care provider. Document Revised: 07/22/2017 Document Reviewed:  08/02/2016 Elsevier Patient Education  2020 [employer].

## 2020-06-25 NOTE — Progress Notes (Signed)
Cory Hoffman is a 44 y.o. male  Chief Complaint  Patient presents with  . Acute Visit    elevated BP, reading at home 158/112, 162/108, having HA's x 2 weeks.  declined Flu shot.   HPI: Cory Hoffman is a 44 y.o. male patient of Dr. Doreene Burke seen today for HTN f/u.  Pt is not currently taking any medication for this.  Pt notes readings at home of 158/112, 162/108. Pt endorses headaches on/off x 2 wks. Ibuprofen relieves headache within 1-2hrs for the most part.  No dizziness, vision changes, n/v, CP, SOB.   Lab Results  Component Value Date   BUN 9 07/04/2019   Lab Results  Component Value Date   CREATININE 0.97 07/04/2019   Lab Results  Component Value Date   NA 142 07/04/2019   K 4.4 07/04/2019   CL 107 (H) 07/04/2019   CO2 23 07/04/2019    Past Medical History:  Diagnosis Date  . Chronic headaches   . Frequent headaches   . HTN (hypertension)   . Hyperlipidemia   . Migraines     Past Surgical History:  Procedure Laterality Date  . BACK SURGERY    . KNEE ARTHROSCOPY    . WISDOM TOOTH EXTRACTION      Social History   Socioeconomic History  . Marital status: Married    Spouse name: Not on file  . Number of children: 4  . Years of education: 28  . Highest education level: Not on file  Occupational History  . Occupation: Data processing manager  Tobacco Use  . Smoking status: Current Every Day Smoker    Packs/day: 1.00    Years: 15.00    Pack years: 15.00    Types: Cigarettes  . Smokeless tobacco: Never Used  Vaping Use  . Vaping Use: Never used  Substance and Sexual Activity  . Alcohol use: Yes    Alcohol/week: 0.0 standard drinks    Comment: up to 3-4 drinks in a given setting sporadically.  . Drug use: No  . Sexual activity: Yes  Other Topics Concern  . Not on file  Social History Narrative   Fun/Hobby: Racing, Photographer.    Social Determinants of Health   Financial Resource Strain:   . Difficulty of Paying Living Expenses: Not on file    Food Insecurity:   . Worried About Programme researcher, broadcasting/film/video in the Last Year: Not on file  . Ran Out of Food in the Last Year: Not on file  Transportation Needs:   . Lack of Transportation (Medical): Not on file  . Lack of Transportation (Non-Medical): Not on file  Physical Activity:   . Days of Exercise per Week: Not on file  . Minutes of Exercise per Session: Not on file  Stress:   . Feeling of Stress : Not on file  Social Connections:   . Frequency of Communication with Friends and Family: Not on file  . Frequency of Social Gatherings with Friends and Family: Not on file  . Attends Religious Services: Not on file  . Active Member of Clubs or Organizations: Not on file  . Attends Banker Meetings: Not on file  . Marital Status: Not on file  Intimate Partner Violence:   . Fear of Current or Ex-Partner: Not on file  . Emotionally Abused: Not on file  . Physically Abused: Not on file  . Sexually Abused: Not on file    Family History  Problem Relation Age of  Onset  . Heart disease Mother   . Cancer - Lung Mother   . Heart disease Father   . Stroke Paternal Grandmother   . Heart attack Paternal Grandfather        Mid 65s     Immunization History  Administered Date(s) Administered  . Janssen (J&J) SARS-COV-2 Vaccination 03/19/2020  . Tdap 03/06/2009, 03/07/2014    Outpatient Encounter Medications as of 06/25/2020  Medication Sig  . atorvastatin (LIPITOR) 40 MG tablet TAKE 1 TABLET(40 MG) BY MOUTH DAILY  . omeprazole (PRILOSEC) 20 MG capsule TAKE 1 CAPSULE(20 MG) BY MOUTH DAILY  . zolpidem (AMBIEN) 10 MG tablet TAKE 1 TABLET BY MOUTH EVERY DAY AT BEDTIME AS NEEDED FOR SLEEP   No facility-administered encounter medications on file as of 06/25/2020.     ROS: Pertinent positives and negatives noted in HPI. Remainder of ROS non-contributory   No Known Allergies  BP 124/82   Pulse 84   Temp 97.8 F (36.6 C) (Temporal)   Ht 5\' 10"  (1.778 m)   Wt 207 lb 12.8  oz (94.3 kg)   SpO2 98%   BMI 29.82 kg/m    BP Readings from Last 3 Encounters:  06/25/20 124/82  11/27/19 120/78  07/04/19 130/82   Pulse Readings from Last 3 Encounters:  06/25/20 84  11/27/19 93  07/04/19 71   Wt Readings from Last 3 Encounters:  06/25/20 207 lb 12.8 oz (94.3 kg)  11/27/19 210 lb 6.4 oz (95.4 kg)  07/04/19 206 lb 6.4 oz (93.6 kg)    Physical Exam Constitutional:      General: He is not in acute distress.    Appearance: Normal appearance. He is not ill-appearing.  Cardiovascular:     Rate and Rhythm: Normal rate and regular rhythm.     Pulses: Normal pulses.  Pulmonary:     Effort: Pulmonary effort is normal.     Breath sounds: Normal breath sounds. No wheezing, rhonchi or rales.  Musculoskeletal:     Right lower leg: No edema.     Left lower leg: No edema.  Neurological:     Mental Status: He is alert.  Psychiatric:        Mood and Affect: Mood normal.        Behavior: Behavior normal.     A/P:  1. Essential hypertension - normal readings here in office - pt will check BP at home 3-4x/wk x 2 wks and then either send readings thru mychart or call office with them. If SBP > 140 / DBP >90, plan to restart medication - discussed low sodium diet and included info in AVS     This visit occurred during the SARS-CoV-2 public health emergency.  Safety protocols were in place, including screening questions prior to the visit, additional usage of staff PPE, and extensive cleaning of exam room while observing appropriate contact time as indicated for disinfecting solutions.

## 2020-08-16 ENCOUNTER — Other Ambulatory Visit: Payer: Self-pay | Admitting: Family Medicine

## 2020-08-16 DIAGNOSIS — E7800 Pure hypercholesterolemia, unspecified: Secondary | ICD-10-CM

## 2020-11-14 ENCOUNTER — Other Ambulatory Visit: Payer: Self-pay | Admitting: Family

## 2020-11-14 DIAGNOSIS — E7800 Pure hypercholesterolemia, unspecified: Secondary | ICD-10-CM

## 2020-11-18 ENCOUNTER — Other Ambulatory Visit: Payer: Self-pay | Admitting: Family Medicine

## 2020-11-18 DIAGNOSIS — G4700 Insomnia, unspecified: Secondary | ICD-10-CM

## 2020-11-29 ENCOUNTER — Other Ambulatory Visit: Payer: Self-pay | Admitting: Family Medicine

## 2020-11-29 DIAGNOSIS — K219 Gastro-esophageal reflux disease without esophagitis: Secondary | ICD-10-CM

## 2020-12-29 ENCOUNTER — Encounter: Payer: Self-pay | Admitting: Family Medicine

## 2020-12-29 NOTE — Telephone Encounter (Signed)
Please see message and advise.  Thank you. ° °

## 2021-01-21 ENCOUNTER — Encounter: Payer: Self-pay | Admitting: Family Medicine

## 2021-01-21 ENCOUNTER — Other Ambulatory Visit: Payer: Self-pay | Admitting: Family Medicine

## 2021-01-21 DIAGNOSIS — G4700 Insomnia, unspecified: Secondary | ICD-10-CM

## 2021-01-21 MED ORDER — ZOLPIDEM TARTRATE 10 MG PO TABS: 10.0000 mg | ORAL_TABLET | Freq: Every evening | ORAL | 0 refills | Status: DC | PRN

## 2021-02-12 ENCOUNTER — Other Ambulatory Visit: Payer: Self-pay | Admitting: Family

## 2021-02-12 DIAGNOSIS — E7800 Pure hypercholesterolemia, unspecified: Secondary | ICD-10-CM

## 2021-02-16 ENCOUNTER — Other Ambulatory Visit: Payer: Self-pay | Admitting: Family Medicine

## 2021-02-16 DIAGNOSIS — G4700 Insomnia, unspecified: Secondary | ICD-10-CM

## 2021-02-18 ENCOUNTER — Encounter: Payer: Self-pay | Admitting: Family Medicine

## 2021-02-21 ENCOUNTER — Other Ambulatory Visit: Payer: Self-pay | Admitting: Family Medicine

## 2021-02-21 DIAGNOSIS — K219 Gastro-esophageal reflux disease without esophagitis: Secondary | ICD-10-CM

## 2021-03-04 ENCOUNTER — Other Ambulatory Visit: Payer: Self-pay

## 2021-03-05 ENCOUNTER — Encounter: Payer: Self-pay | Admitting: Family Medicine

## 2021-03-05 ENCOUNTER — Ambulatory Visit: Payer: BC Managed Care – PPO | Admitting: Family Medicine

## 2021-03-05 VITALS — BP 110/80 | HR 67 | Temp 97.0°F | Ht 70.0 in | Wt 202.0 lb

## 2021-03-05 DIAGNOSIS — E7800 Pure hypercholesterolemia, unspecified: Secondary | ICD-10-CM | POA: Diagnosis not present

## 2021-03-05 DIAGNOSIS — K219 Gastro-esophageal reflux disease without esophagitis: Secondary | ICD-10-CM | POA: Diagnosis not present

## 2021-03-05 DIAGNOSIS — Z79899 Other long term (current) drug therapy: Secondary | ICD-10-CM

## 2021-03-05 DIAGNOSIS — G4700 Insomnia, unspecified: Secondary | ICD-10-CM | POA: Diagnosis not present

## 2021-03-05 DIAGNOSIS — I10 Essential (primary) hypertension: Secondary | ICD-10-CM | POA: Diagnosis not present

## 2021-03-05 MED ORDER — ZOLPIDEM TARTRATE 10 MG PO TABS: ORAL_TABLET | ORAL | 2 refills | Status: DC

## 2021-03-05 MED ORDER — ATORVASTATIN CALCIUM 40 MG PO TABS: ORAL_TABLET | ORAL | 3 refills | Status: DC

## 2021-03-05 MED ORDER — OMEPRAZOLE 20 MG PO CPDR: DELAYED_RELEASE_CAPSULE | ORAL | 3 refills | Status: DC

## 2021-03-05 MED ORDER — ZOLPIDEM TARTRATE 10 MG PO TABS: ORAL_TABLET | ORAL | 0 refills | Status: DC

## 2021-03-05 NOTE — Addendum Note (Signed)
Addended by: Renaldo Reel S on: 03/05/2021 09:31 AM   Modules accepted: Orders

## 2021-03-05 NOTE — Addendum Note (Signed)
Addended by: Renaldo Reel S on: 03/05/2021 09:04 AM   Modules accepted: Orders

## 2021-03-05 NOTE — Progress Notes (Signed)
Cory Hoffman is a 45 y.o. male  Chief Complaint  Patient presents with   Medication Refill    Pt here for follow up, he would like a refill on all medications today.  He is fasting in case of any lab work.  Pt has no other concerns to address today.    HPI: Cory Hoffman is a 45 y.o. male patient of Dr. Doreene Burke who is seen today for routine f/u on insomnia and medication refill. Pt takes ambien 10mg  qHS.  Pt feels med/dose are effective. No side effects.  Denies dizziness, CP, palpitations, SOB, daytime fatigue.   He is also due for hypercholesterolemia f/u for which he takes lipitor 40mg  daily.  He also takes prilosec 20mg  daily for GERD and states his symptoms are well-controlled. He is due for labs and is fasting today.  Past Medical History:  Diagnosis Date   Chronic headaches    Frequent headaches    HTN (hypertension)    Hyperlipidemia    Migraines     Past Surgical History:  Procedure Laterality Date   BACK SURGERY     KNEE ARTHROSCOPY     WISDOM TOOTH EXTRACTION      Social History   Socioeconomic History   Marital status: Married    Spouse name: Not on file   Number of children: 4   Years of education: 16   Highest education level: Not on file  Occupational History   Occupation:  Tobacco Use   Smoking status: Every Day    Packs/day: 1.00    Years: 15.00    Pack years: 15.00    Types: Cigarettes   Smokeless tobacco: Never  Vaping Use   Vaping Use: Never used  Substance and Sexual Activity   Alcohol use: Yes    Alcohol/week: 0.0 standard drinks    Comment: up to 3-4 drinks in a given setting sporadically.   Drug use: No   Sexual activity: Yes  Other Topics Concern   Not on file  Social History Narrative   Fun/Hobby: Racing, drag racing.    Social Determinants of Health   Financial Resource Strain: Not on file  Food Insecurity: Not on file  Transportation Needs: Not on file  Physical Activity: Not on file  Stress: Not on  file  Social Connections: Not on file  Intimate Partner Violence: Not on file    Family History  Problem Relation Age of Onset   Heart disease Mother    Cancer - Lung Mother    Heart disease Father    Stroke Paternal Grandmother    Heart attack Paternal Grandfather        Mid 45s     Immunization History  Administered Date(s) Administered   (J&J) SARS-COV-2 Vaccination 03/19/2020   Tdap 03/06/2009, 03/07/2014    Outpatient Encounter Medications as of 03/05/2021  Medication Sig   [DISCONTINUED] atorvastatin (LIPITOR) 40 MG tablet TAKE 1 TABLET(40 MG) BY MOUTH DAILY   [DISCONTINUED] omeprazole (PRILOSEC) 20 MG capsule TAKE 1 CAPSULE(20 MG) BY MOUTH DAILY   [DISCONTINUED] zolpidem (AMBIEN) 10 MG tablet TAKE 1 TABLET(10 MG) BY MOUTH AT BEDTIME AS NEEDED FOR SLEEP   atorvastatin (LIPITOR) 40 MG tablet TAKE 1 TABLET(40 MG) BY MOUTH DAILY   omeprazole (PRILOSEC) 20 MG capsule TAKE 1 CAPSULE(20 MG) BY MOUTH DAILY   zolpidem (AMBIEN) 10 MG tablet TAKE 1 TABLET(10 MG) BY MOUTH AT BEDTIME AS NEEDED FOR SLEEP   No facility-administered encounter medications on file  as of 03/05/2021.     ROS: Pertinent positives and negatives noted in HPI. Remainder of ROS non-contributory  No Known Allergies  BP 110/80 (BP Location: Left Arm, Patient Position: Sitting, Cuff Size: Normal)   Pulse 67   Temp (!) 97 F (36.1 C) (Temporal)   Ht 5\' 10"  (1.778 m)   Wt 202 lb (91.6 kg)   SpO2 97%   BMI 28.98 kg/m  Wt Readings from Last 3 Encounters:  03/05/21 202 lb (91.6 kg)  06/25/20 207 lb 12.8 oz (94.3 kg)  11/27/19 210 lb 6.4 oz (95.4 kg)   Temp Readings from Last 3 Encounters:  03/05/21 (!) 97 F (36.1 C) (Temporal)  06/25/20 97.8 F (36.6 C) (Temporal)  11/27/19 (!) 97.4 F (36.3 C) (Tympanic)   BP Readings from Last 3 Encounters:  03/05/21 110/80  06/25/20 124/82  11/27/19 120/78   Pulse Readings from Last 3 Encounters:  03/05/21 67  06/25/20 84  11/27/19 93      Physical Exam Constitutional:      General: He is not in acute distress.    Appearance: He is not ill-appearing.  Cardiovascular:     Rate and Rhythm: Normal rate and regular rhythm.     Pulses: Normal pulses.  Pulmonary:     Effort: Pulmonary effort is normal.     Breath sounds: Normal breath sounds. No wheezing or rhonchi.  Neurological:     Mental Status: He is alert and oriented to person, place, and time.  Psychiatric:        Mood and Affect: Mood normal.        Behavior: Behavior normal.     A/P:   1. Insomnia, unspecified type - stable, controlled on current med - database reviewed and appropriate - UDS today - DRUG MONITORING, PANEL 8 WITH CONFIRMATION, URINE Refill: - zolpidem (AMBIEN) 10 MG tablet; TAKE 1 TABLET(10 MG) BY MOUTH AT BEDTIME AS NEEDED FOR SLEEP  Dispense: 30 tablet; Refill: 2 - f/u with PCP in 3 mo or sooner PRN  2. High risk medications (not anticoagulants) long-term use - DRUG MONITORING, PANEL 8 WITH CONFIRMATION, URINE  3. Hypercholesteremia Refill: - atorvastatin (LIPITOR) 40 MG tablet; TAKE 1 TABLET(40 MG) BY MOUTH DAILY  Dispense: 90 tablet; Refill: 3 - Comprehensive metabolic panel - Lipid panel  4. Gastroesophageal reflux disease without esophagitis - stable, controlled Refill: - omeprazole (PRILOSEC) 20 MG capsule; TAKE 1 CAPSULE(20 MG) BY MOUTH DAILY  Dispense: 90 capsule; Refill: 3  5. Essential hypertension - controlled, at goal - not on Rx med - CBC - Comprehensive metabolic panel    This visit occurred during the SARS-CoV-2 public health emergency.  Safety protocols were in place, including screening questions prior to the visit, additional usage of staff PPE, and extensive cleaning of exam room while observing appropriate contact time as indicated for disinfecting solutions.

## 2021-03-06 ENCOUNTER — Encounter: Payer: Self-pay | Admitting: Family Medicine

## 2021-03-06 DIAGNOSIS — E782 Mixed hyperlipidemia: Secondary | ICD-10-CM

## 2021-03-06 LAB — URINE DRUGS OF ABUSE SCREEN W ALC, ROUTINE (REF LAB)
Amphetamines, Urine: NEGATIVE ng/mL
Barbiturate Quant, Ur: NEGATIVE ng/mL
Benzodiazepine Quant, Ur: NEGATIVE ng/mL
Cannabinoid Quant, Ur: NEGATIVE ng/mL
Cocaine (Metab.): NEGATIVE ng/mL
Ethanol, Urine: NEGATIVE %
Methadone Screen, Urine: NEGATIVE ng/mL
Opiate Quant, Ur: NEGATIVE ng/mL
PCP Quant, Ur: NEGATIVE ng/mL
Propoxyphene: NEGATIVE ng/mL

## 2021-03-06 LAB — CBC
Hematocrit: 47.5 % (ref 37.5–51.0)
Hemoglobin: 16.7 g/dL (ref 13.0–17.7)
MCH: 30.4 pg (ref 26.6–33.0)
MCHC: 35.2 g/dL (ref 31.5–35.7)
MCV: 87 fL (ref 79–97)
Platelets: 270 10*3/uL (ref 150–450)
RBC: 5.49 x10E6/uL (ref 4.14–5.80)
RDW: 12.9 % (ref 11.6–15.4)
WBC: 8.7 10*3/uL (ref 3.4–10.8)

## 2021-03-06 LAB — LIPID PANEL
Chol/HDL Ratio: 5.6 ratio — ABNORMAL HIGH (ref 0.0–5.0)
Cholesterol, Total: 190 mg/dL (ref 100–199)
HDL: 34 mg/dL — ABNORMAL LOW (ref >39)
LDL Chol Calc (NIH): 114 mg/dL — ABNORMAL HIGH (ref 0–99)
Triglycerides: 241 mg/dL — ABNORMAL HIGH (ref 0–149)
VLDL Cholesterol Cal: 42 mg/dL — ABNORMAL HIGH (ref 5–40)

## 2021-03-06 LAB — COMPREHENSIVE METABOLIC PANEL
ALT: 22 IU/L (ref 0–44)
AST: 14 IU/L (ref 0–40)
Albumin/Globulin Ratio: 2.3 — ABNORMAL HIGH (ref 1.2–2.2)
Albumin: 4.5 g/dL (ref 4.0–5.0)
Alkaline Phosphatase: 83 IU/L (ref 44–121)
BUN/Creatinine Ratio: 10 (ref 9–20)
BUN: 8 mg/dL (ref 6–24)
Bilirubin Total: 0.4 mg/dL (ref 0.0–1.2)
CO2: 24 mmol/L (ref 20–29)
Calcium: 9.5 mg/dL (ref 8.7–10.2)
Chloride: 104 mmol/L (ref 96–106)
Creatinine, Ser: 0.81 mg/dL (ref 0.76–1.27)
Globulin, Total: 2 g/dL (ref 1.5–4.5)
Glucose: 90 mg/dL (ref 65–99)
Potassium: 4.5 mmol/L (ref 3.5–5.2)
Sodium: 142 mmol/L (ref 134–144)
Total Protein: 6.5 g/dL (ref 6.0–8.5)
eGFR: 113 mL/min/{1.73_m2} (ref >59)

## 2021-03-06 MED ORDER — FENOFIBRATE 145 MG PO TABS: 145.0000 mg | ORAL_TABLET | Freq: Every day | ORAL | 1 refills | Status: DC

## 2021-03-23 ENCOUNTER — Telehealth: Payer: Self-pay

## 2021-03-23 DIAGNOSIS — E7800 Pure hypercholesterolemia, unspecified: Secondary | ICD-10-CM

## 2021-03-23 NOTE — Telephone Encounter (Signed)
called and informed pt of PA denial for fenofibrate 145 mg tabs and gave reason for denial. Pt voiced understanding.

## 2021-03-24 NOTE — Telephone Encounter (Signed)
Pt's wife(Kari) is wanting a cb concerning this issue.   Savalas Monje (Spouse)      [phone]

## 2021-03-26 MED ORDER — FENOFIBRATE 48 MG PO TABS: 48.0000 mg | ORAL_TABLET | Freq: Every day | ORAL | 1 refills | Status: DC

## 2021-03-26 NOTE — Addendum Note (Signed)
Addended by: Willaim Bane on: 03/26/2021 01:33 PM   Modules accepted: Orders

## 2021-03-26 NOTE — Telephone Encounter (Signed)
Rx fenofibrate 48 mg refill sent to pharmacy.

## 2021-04-07 ENCOUNTER — Ambulatory Visit: Payer: BC Managed Care – PPO | Admitting: Family Medicine

## 2021-04-07 ENCOUNTER — Encounter: Payer: Self-pay | Admitting: Family Medicine

## 2021-04-07 ENCOUNTER — Other Ambulatory Visit: Payer: Self-pay

## 2021-04-07 VITALS — BP 132/80 | HR 83 | Temp 97.0°F | Ht 70.0 in | Wt 200.8 lb

## 2021-04-07 DIAGNOSIS — R3911 Hesitancy of micturition: Secondary | ICD-10-CM | POA: Diagnosis not present

## 2021-04-07 DIAGNOSIS — R39198 Other difficulties with micturition: Secondary | ICD-10-CM

## 2021-04-07 DIAGNOSIS — F17210 Nicotine dependence, cigarettes, uncomplicated: Secondary | ICD-10-CM | POA: Diagnosis not present

## 2021-04-07 DIAGNOSIS — R300 Dysuria: Secondary | ICD-10-CM | POA: Diagnosis not present

## 2021-04-07 DIAGNOSIS — R3915 Urgency of urination: Secondary | ICD-10-CM | POA: Diagnosis not present

## 2021-04-07 DIAGNOSIS — E782 Mixed hyperlipidemia: Secondary | ICD-10-CM | POA: Diagnosis not present

## 2021-04-07 DIAGNOSIS — G4700 Insomnia, unspecified: Secondary | ICD-10-CM

## 2021-04-07 LAB — POCT URINALYSIS DIPSTICK
Bilirubin, UA: NEGATIVE
Glucose, UA: NEGATIVE
Ketones, UA: NEGATIVE
Nitrite, UA: NEGATIVE
Protein, UA: POSITIVE — AB
Spec Grav, UA: 1.01 (ref 1.010–1.025)
Urobilinogen, UA: 0.2 E.U./dL
pH, UA: 6.5 (ref 5.0–8.0)

## 2021-04-07 LAB — URINALYSIS, ROUTINE W REFLEX MICROSCOPIC
Bilirubin Urine: NEGATIVE
Ketones, ur: NEGATIVE
Nitrite: NEGATIVE
Specific Gravity, Urine: <=1.005 — AB (ref 1.000–1.030)
Total Protein, Urine: NEGATIVE
Urine Glucose: NEGATIVE
Urobilinogen, UA: 0.2 (ref 0.0–1.0)
pH: 6.5 (ref 5.0–8.0)

## 2021-04-07 MED ORDER — ESZOPICLONE 2 MG PO TABS: 2.0000 mg | ORAL_TABLET | Freq: Every evening | ORAL | 0 refills | Status: DC | PRN

## 2021-04-07 MED ORDER — SULFAMETHOXAZOLE-TRIMETHOPRIM 800-160 MG PO TABS: 1.0000 | ORAL_TABLET | Freq: Two times a day (BID) | ORAL | 0 refills | Status: DC

## 2021-04-07 NOTE — Progress Notes (Signed)
Established Patient Office Visit  Subjective:  Patient ID: Cory Hoffman, male    DOB: 07/08/1976  Age: 45 y.o. MRN: [id number]  CC:  Chief Complaint  Patient presents with   Back Pain    Lower back pains, dysuria symptoms started last night. Low grade temp yesterday.     HPI Cory Hoffman presents for 1 day history of dysuria, urgency, hesitancy with urination and lower back pain.  Denies discharge.  No extramarital intercourse.  Urine flow has been excellent up until yesterday.  No history of prostate issues.  Father passed at age 49 from a massive MI.  Patient reports fasting status for his last lipid profile.  He continues with atorvastatin and fenofibrate.  Patient continues to smoke.  Continues with Ambien on a daily basis for sleep.  Has not tried anything else.  Past Medical History:  Diagnosis Date   Chronic headaches    Frequent headaches    HTN (hypertension)    Hyperlipidemia    Migraines     Past Surgical History:  Procedure Laterality Date   BACK SURGERY     KNEE ARTHROSCOPY     WISDOM TOOTH EXTRACTION      Family History  Problem Relation Age of Onset   Heart disease Mother    Cancer - Lung Mother    Heart disease Father    Stroke Paternal Grandmother    Heart attack Paternal Grandfather        Mid 30s    Social History   Socioeconomic History   Marital status: Married    Spouse name: Not on file   Number of children: 4   Years of education: 16   Highest education level: Not on file  Occupational History   Occupation: Air cabin crew  Tobacco Use   Smoking status: Every Day    Packs/day: 1.00    Years: 15.00    Pack years: 15.00    Types: Cigarettes   Smokeless tobacco: Never  Vaping Use   Vaping Use: Never used  Substance and Sexual Activity   Alcohol use: Yes    Alcohol/week: 0.0 standard drinks    Comment: up to 3-4 drinks in a given setting sporadically.   Drug use: No   Sexual activity: Yes  Other Topics Concern   Not on  file  Social History Narrative   Fun/Hobby: Racing, drag racing.    Social Determinants of Health   Financial Resource Strain: Not on file  Food Insecurity: Not on file  Transportation Needs: Not on file  Physical Activity: Not on file  Stress: Not on file  Social Connections: Not on file  Intimate Partner Violence: Not on file    Outpatient Medications Prior to Visit  Medication Sig Dispense Refill   atorvastatin (LIPITOR) 40 MG tablet TAKE 1 TABLET(40 MG) BY MOUTH DAILY 90 tablet 3   omeprazole (PRILOSEC) 20 MG capsule TAKE 1 CAPSULE(20 MG) BY MOUTH DAILY 90 capsule 3   zolpidem (AMBIEN) 10 MG tablet TAKE 1 TABLET(10 MG) BY MOUTH AT BEDTIME AS NEEDED FOR SLEEP 30 tablet 2   fenofibrate (TRICOR) 48 MG tablet Take 1 tablet (48 mg total) by mouth daily. 90 tablet 1   No facility-administered medications prior to visit.    No Known Allergies  ROS Review of Systems  Constitutional: Negative.   HENT: Negative.    Cardiovascular: Negative.   Gastrointestinal: Negative.   Genitourinary:  Positive for difficulty urinating, dysuria, frequency and urgency. Negative for decreased urine  volume, genital sores, hematuria and penile discharge.  Musculoskeletal:  Negative for arthralgias and myalgias.  Skin: Negative.   Psychiatric/Behavioral:  Positive for sleep disturbance. Negative for dysphoric mood. The patient is not nervous/anxious.      Objective:    Physical Exam Vitals and nursing note reviewed.  Constitutional:      General: He is not in acute distress.    Appearance: Normal appearance. He is not ill-appearing, toxic-appearing or diaphoretic.  HENT:     Head: Normocephalic and atraumatic.  Eyes:     General: No scleral icterus.       Right eye: No discharge.        Left eye: No discharge.     Extraocular Movements: Extraocular movements intact.     Conjunctiva/sclera: Conjunctivae normal.  Pulmonary:     Effort: Pulmonary effort is normal.  Abdominal:     Hernia:  There is no hernia in the left inguinal area or right inguinal area.  Genitourinary:    Penis: Circumcised. No hypospadias, erythema, tenderness, discharge, swelling or lesions.      Testes:        Right: Mass, tenderness or swelling not present. Right testis is descended.        Left: Mass, tenderness or swelling not present. Left testis is descended.     Epididymis:     Right: Not inflamed or enlarged.     Left: Not inflamed or enlarged.     Prostate: Not enlarged, not tender and no nodules present.     Rectum: Guaiac result negative. No mass, tenderness, anal fissure, external hemorrhoid or internal hemorrhoid. Normal anal tone.  Lymphadenopathy:     Lower Body: No right inguinal adenopathy. No left inguinal adenopathy.  Skin:    General: Skin is warm and dry.     Findings: No rash.  Neurological:     Mental Status: He is alert and oriented to person, place, and time.  Psychiatric:        Mood and Affect: Mood normal.        Behavior: Behavior normal.    BP 132/80 (BP Location: Right Arm, Patient Position: Sitting, Cuff Size: Normal)   Pulse 83   Temp (!) 97 F (36.1 C) (Temporal)   Ht _0  (1.778 m)   Wt 200 lb 12.8 oz (91.1 kg)   SpO2 96%   BMI 28.81 kg/m  Wt Readings from Last 3 Encounters:  04/07/21 200 lb 12.8 oz (91.1 kg)  03/05/21 202 lb (91.6 kg)  06/25/20 207 lb 12.8 oz (94.3 kg)     Health Maintenance Due  Topic Date Due   Pneumococcal Vaccine 75-55 Years old (1 - PCV) Never done   HIV Screening  Never done   Hepatitis C Screening  Never done   INFLUENZA VACCINE  03/23/2021    There are no preventive care reminders to display for this patient.  No results found for: TSH Lab Results  Component Value Date   WBC 8.7 03/05/2021   HGB 16.7 03/05/2021   HCT 47.5 03/05/2021   MCV 87 03/05/2021   PLT 270 03/05/2021   Lab Results  Component Value Date   NA 142 03/05/2021   K 4.5 03/05/2021   CO2 24 03/05/2021   GLUCOSE 90 03/05/2021   BUN 8  03/05/2021   CREATININE 0.81 03/05/2021   BILITOT 0.4 03/05/2021   ALKPHOS 83 03/05/2021   AST 14 03/05/2021   ALT 22 03/05/2021   PROT 6.5 03/05/2021  ALBUMIN 4.5 03/05/2021   CALCIUM 9.5 03/05/2021   EGFR 113 03/05/2021   GFR 81.50 03/10/2018   Lab Results  Component Value Date   CHOL 190 03/05/2021   Lab Results  Component Value Date   HDL 34 (L) 03/05/2021   Lab Results  Component Value Date   LDLCALC 114 (H) 03/05/2021   Lab Results  Component Value Date   TRIG 241 (H) 03/05/2021   Lab Results  Component Value Date   CHOLHDL 5.6 (H) 03/05/2021   No results found for: HGBA1C    Assessment & Plan:   Problem List Items Addressed This Visit       Other   Insomnia   Relevant Medications   eszopiclone (LUNESTA) 2 MG TABS tablet   Nicotine dependence, cigarettes, uncomplicated   Mixed hyperlipidemia   Other Visit Diagnoses     Difficulty urinating    -  Primary   Relevant Medications   sulfamethoxazole-trimethoprim (BACTRIM DS) 800-160 MG tablet   Other Relevant Orders   POCT Urinalysis Dipstick (Completed)   Urine Culture   Urinalysis, Routine w reflex microscopic   Urine cytology ancillary only   Dysuria       Relevant Medications   sulfamethoxazole-trimethoprim (BACTRIM DS) 800-160 MG tablet   Other Relevant Orders   Urinalysis, Routine w reflex microscopic   Urine cytology ancillary only       Meds ordered this encounter  Medications   DISCONTD: eszopiclone (LUNESTA) 2 MG TABS tablet    Sig: Take 1 tablet (2 mg total) by mouth at bedtime as needed for sleep. Take immediately before bedtime    Dispense:  30 tablet    Refill:  0   sulfamethoxazole-trimethoprim (BACTRIM DS) 800-160 MG tablet    Sig: Take 1 tablet by mouth 2 (two) times daily for 10 days.    Dispense:  20 tablet    Refill:  0   eszopiclone (LUNESTA) 2 MG TABS tablet    Sig: Take 1 tablet (2 mg total) by mouth at bedtime as needed for sleep. Take immediately before bedtime     Dispense:  30 tablet    Refill:  0     Follow-up: Return in about 10 days (around 04/17/2021), or if symptoms worsen or fail to improve.  Given information on dysuria.  Explained that it is routine to check for STDs.  UA and urine culture have also been ordered.  He will take Septra twice daily for 10 days.   issue of familial cholesterolemia.  Her goal is to prevent him from experiencing his father's estate who died from a massive MI at age 84.  Information was given on quitting smoking.  He is aware that quitting smoking would greatly reduce his risk for cardiovascular event.  He is compliant with his statin and fenofibrate.  Discussed the risks of taking Ambien chronically to include sleep aberration, falls and cancers.  He agrees to give Lunesta to try.  Libby Maw, MD

## 2021-04-09 ENCOUNTER — Encounter: Payer: Self-pay | Admitting: Family Medicine

## 2021-04-09 ENCOUNTER — Telehealth: Payer: Self-pay | Admitting: Family Medicine

## 2021-04-09 LAB — URINE CULTURE
MICRO NUMBER:: 12249401
SPECIMEN QUALITY:: ADEQUATE

## 2021-04-09 NOTE — Telephone Encounter (Signed)
Pt noticed a lab result dropping in his mychart stating(from what they read) he is positive for e coli. Please advise pt at [phone].

## 2021-04-10 NOTE — Telephone Encounter (Signed)
This nurse went over lab results that patient/spouse received via mychart. Gave clinical explanation for E. Coli being the identified reason for UTI. Encouraged patient to complete antibiotic treatment and to call back if symptoms persistent.

## 2021-04-17 ENCOUNTER — Other Ambulatory Visit: Payer: Self-pay

## 2021-04-17 ENCOUNTER — Encounter: Payer: Self-pay | Admitting: Family Medicine

## 2021-04-17 ENCOUNTER — Ambulatory Visit: Payer: BC Managed Care – PPO | Admitting: Family Medicine

## 2021-04-17 VITALS — BP 124/84 | HR 86 | Temp 97.8°F | Ht 70.0 in | Wt 200.0 lb

## 2021-04-17 DIAGNOSIS — Z87440 Personal history of urinary (tract) infections: Secondary | ICD-10-CM

## 2021-04-17 DIAGNOSIS — G4700 Insomnia, unspecified: Secondary | ICD-10-CM

## 2021-04-17 DIAGNOSIS — E7800 Pure hypercholesterolemia, unspecified: Secondary | ICD-10-CM

## 2021-04-17 DIAGNOSIS — F17210 Nicotine dependence, cigarettes, uncomplicated: Secondary | ICD-10-CM | POA: Diagnosis not present

## 2021-04-17 DIAGNOSIS — E782 Mixed hyperlipidemia: Secondary | ICD-10-CM | POA: Diagnosis not present

## 2021-04-17 DIAGNOSIS — T887XXA Unspecified adverse effect of drug or medicament, initial encounter: Secondary | ICD-10-CM | POA: Insufficient documentation

## 2021-04-17 DIAGNOSIS — Z79899 Other long term (current) drug therapy: Secondary | ICD-10-CM

## 2021-04-17 MED ORDER — ZOLPIDEM TARTRATE 10 MG PO TABS: 10.0000 mg | ORAL_TABLET | Freq: Every evening | ORAL | 1 refills | Status: DC | PRN

## 2021-04-17 NOTE — Progress Notes (Addendum)
Established Patient Office Visit  Subjective:  Patient ID: Cory Hoffman, male    DOB: 03/09/1976  Age: 45 y.o. MRN: [id number]  CC:  Chief Complaint  Patient presents with   Follow-up    Follow up on dysuria and back pains. Patient would also like to switch back to Ambien.     HPI Cory Hoffman presents for follow-up of recent UTI, insomnia, tobacco use and hyperlipidemia.  Continues with atorvastatin and fenofibrate.  He is no longer having symptoms of UTI including frequency dysuria and urgency.  Continues to smoke about a pack a day.  Actually finishes cigarettes on occasion.  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  Johnnye Sima was not effective for him.  Ambien is works the best for him.  Past Medical History:  Diagnosis Date   Chronic headaches    Frequent headaches    HTN (hypertension)    Hyperlipidemia    Migraines     Past Surgical History:  Procedure Laterality Date   BACK SURGERY     KNEE ARTHROSCOPY     WISDOM TOOTH EXTRACTION      Family History  Problem Relation Age of Onset   Heart disease Mother    Cancer - Lung Mother    Heart disease Father    Stroke Paternal Grandmother    Heart attack Paternal Grandfather        Mid 6s    Social History   Socioeconomic History   Marital status: Married    Spouse name: Not on file   Number of children: 4   Years of education: 16   Highest education level: Not on file  Occupational History   Occupation: Air cabin crew  Tobacco Use   Smoking status: Every Day    Packs/day: 1.00    Years: 15.00    Pack years: 15.00    Types: Cigarettes   Smokeless tobacco: Never  Vaping Use   Vaping Use: Never used  Substance and Sexual Activity   Alcohol use: Yes    Alcohol/week: 0.0 standard drinks    Comment: up to 3-4 drinks in a given setting sporadically.   Drug use: No   Sexual activity: Yes  Other Topics Concern   Not on file  Social History Narrative   Fun/Hobby: Racing, drag racing.    Social  Determinants of Health   Financial Resource Strain: Not on file  Food Insecurity: Not on file  Transportation Needs: Not on file  Physical Activity: Not on file  Stress: Not on file  Social Connections: Not on file  Intimate Partner Violence: Not on file    Outpatient Medications Prior to Visit  Medication Sig Dispense Refill   omeprazole (PRILOSEC) 20 MG capsule TAKE 1 CAPSULE(20 MG) BY MOUTH DAILY 90 capsule 3   atorvastatin (LIPITOR) 40 MG tablet TAKE 1 TABLET(40 MG) BY MOUTH DAILY 90 tablet 3   eszopiclone (LUNESTA) 2 MG TABS tablet Take 1 tablet (2 mg total) by mouth at bedtime as needed for sleep. Take immediately before bedtime 30 tablet 0   fenofibrate (TRICOR) 48 MG tablet Take 1 tablet (48 mg total) by mouth daily. 90 tablet 1   sulfamethoxazole-trimethoprim (BACTRIM DS) 800-160 MG tablet Take 1 tablet by mouth 2 (two) times daily for 10 days. 20 tablet 0   No facility-administered medications prior to visit.    No Known Allergies  ROS Review of Systems  Constitutional: Negative.   HENT: Negative.    Eyes:  Negative for  photophobia and visual disturbance.  Respiratory: Negative.    Cardiovascular: Negative.   Gastrointestinal: Negative.   Genitourinary:  Negative for difficulty urinating, dysuria, frequency and urgency.  Musculoskeletal:  Negative for back pain.  Neurological:  Negative for speech difficulty and weakness.  Psychiatric/Behavioral:  Positive for sleep disturbance.      Objective:    Physical Exam Vitals and nursing note reviewed.  Constitutional:      General: He is not in acute distress.    Appearance: Normal appearance. He is not ill-appearing, toxic-appearing or diaphoretic.  HENT:     Head: Normocephalic and atraumatic.     Right Ear: External ear normal.     Left Ear: External ear normal.  Eyes:     General: No scleral icterus.       Right eye: No discharge.        Left eye: No discharge.     Conjunctiva/sclera: Conjunctivae normal.   Pulmonary:     Effort: Pulmonary effort is normal.  Skin:    General: Skin is warm and dry.  Neurological:     Mental Status: He is alert and oriented to person, place, and time.    BP 124/84 (BP Location: Right Arm, Patient Position: Sitting, Cuff Size: Normal)   Pulse 86   Temp 97.8 F (36.6 C) (Temporal)   Ht '5\' 10"'  (1.778 m)   Wt 200 lb (90.7 kg)   SpO2 95%   BMI 28.70 kg/m  Wt Readings from Last 3 Encounters:  04/17/21 200 lb (90.7 kg)  04/07/21 200 lb 12.8 oz (91.1 kg)  03/05/21 202 lb (91.6 kg)     Health Maintenance Due  Topic Date Due   HIV Screening  Never done   Hepatitis C Screening  Never done   INFLUENZA VACCINE  Never done    There are no preventive care reminders to display for this patient.  No results found for: TSH Lab Results  Component Value Date   WBC 8.7 03/05/2021   HGB 16.7 03/05/2021   HCT 47.5 03/05/2021   MCV 87 03/05/2021   PLT 270 03/05/2021   Lab Results  Component Value Date   NA 142 03/05/2021   K 4.5 03/05/2021   CO2 24 03/05/2021   GLUCOSE 90 03/05/2021   BUN 8 03/05/2021   CREATININE 0.81 03/05/2021   BILITOT 0.4 03/05/2021   ALKPHOS 83 03/05/2021   AST 14 03/05/2021   ALT 22 03/05/2021   PROT 6.5 03/05/2021   ALBUMIN 4.5 03/05/2021   CALCIUM 9.5 03/05/2021   EGFR 113 03/05/2021   GFR 81.50 03/10/2018   Lab Results  Component Value Date   CHOL 190 03/05/2021   Lab Results  Component Value Date   HDL 34 (L) 03/05/2021   Lab Results  Component Value Date   LDLCALC 114 (H) 03/05/2021   Lab Results  Component Value Date   TRIG 241 (H) 03/05/2021   Lab Results  Component Value Date   CHOLHDL 5.6 (H) 03/05/2021   No results found for: HGBA1C    Assessment & Plan:   Problem List Items Addressed This Visit       Genitourinary   History of UTI   Relevant Orders   Urinalysis, Routine w reflex microscopic (Completed)   Urine Culture (Completed)     Other   Insomnia   Relevant Medications    zolpidem (AMBIEN) 10 MG tablet   Hypercholesteremia   Relevant Medications   pravastatin (PRAVACHOL) 20 MG tablet  Nicotine dependence, cigarettes, uncomplicated   Mixed hyperlipidemia - Primary   Relevant Medications   pravastatin (PRAVACHOL) 20 MG tablet   High risk medications (not anticoagulants) long-term use    Meds ordered this encounter  Medications   zolpidem (AMBIEN) 10 MG tablet    Sig: Take 1 tablet (10 mg total) by mouth at bedtime as needed for sleep.    Dispense:  90 tablet    Refill:  1   pravastatin (PRAVACHOL) 20 MG tablet    Sig: Take 1 tablet (20 mg total) by mouth daily. At night.    Dispense:  90 tablet    Refill:  1    Follow-up: Return in about 6 months (around 10/18/2021).  Again we discussed Ambien concerns.  We discussed smoking cessation and my strong recommendation to quit.  He was given information on steps to quit smoking.  Discussed Chantix.  He has taken it before.  He was given information on Chantix.  Information was given on mixed hyperlipidemia.  Repeat UA with urine culture.  Spent 30 minutes discussing strategies for smoking cessation including Chantix and Wellbutrin.  Follow-up in 6 months.  Libby Maw, MD   9/20 addendum: side effects of headache and myalgias with tricor and  atorvastatin. Stop both and will start pravastatin. Follow up in 3 monhts.

## 2021-04-18 LAB — URINALYSIS, ROUTINE W REFLEX MICROSCOPIC
Bilirubin, UA: NEGATIVE
Glucose, UA: NEGATIVE
Ketones, UA: NEGATIVE
Leukocytes,UA: NEGATIVE
Nitrite, UA: NEGATIVE
Protein,UA: NEGATIVE
RBC, UA: NEGATIVE
Specific Gravity, UA: 1.009 (ref 1.005–1.030)
Urobilinogen, Ur: 0.2 mg/dL (ref 0.2–1.0)
pH, UA: 6.5 (ref 5.0–7.5)

## 2021-04-20 LAB — URINE CULTURE: Organism ID, Bacteria: NO GROWTH

## 2021-05-12 ENCOUNTER — Encounter: Payer: Self-pay | Admitting: Family Medicine

## 2021-05-12 MED ORDER — PRAVASTATIN SODIUM 20 MG PO TABS
20.0000 mg | ORAL_TABLET | Freq: Every day | ORAL | 1 refills | Status: DC
Start: 2021-05-12 — End: 2021-12-08

## 2021-05-12 NOTE — Addendum Note (Signed)
Addended by: Andrez Grime on: 05/12/2021 04:31 PM   Modules accepted: Orders

## 2021-05-31 ENCOUNTER — Encounter: Payer: Self-pay | Admitting: Family Medicine

## 2021-07-23 ENCOUNTER — Ambulatory Visit: Payer: BC Managed Care – PPO | Admitting: Nurse Practitioner

## 2021-07-23 ENCOUNTER — Encounter: Payer: Self-pay | Admitting: Nurse Practitioner

## 2021-07-23 ENCOUNTER — Ambulatory Visit (INDEPENDENT_AMBULATORY_CARE_PROVIDER_SITE_OTHER)
Admission: RE | Admit: 2021-07-23 | Discharge: 2021-07-23 | Disposition: A | Discharge status: Home / Self Care | Payer: BC Managed Care – PPO | Source: Ambulatory Visit | Attending: Nurse Practitioner | Admitting: Nurse Practitioner

## 2021-07-23 ENCOUNTER — Other Ambulatory Visit: Payer: Self-pay

## 2021-07-23 VITALS — BP 144/96 | HR 75 | Temp 97.3°F | Resp 14 | Ht 70.0 in | Wt 203.2 lb

## 2021-07-23 DIAGNOSIS — M545 Low back pain, unspecified: Secondary | ICD-10-CM

## 2021-07-23 DIAGNOSIS — S39012A Strain of muscle, fascia and tendon of lower back, initial encounter: Secondary | ICD-10-CM | POA: Diagnosis not present

## 2021-07-23 MED ORDER — PREDNISONE 20 MG PO TABS: ORAL_TABLET | ORAL | 0 refills | Status: AC

## 2021-07-23 MED ORDER — METHOCARBAMOL 500 MG PO TABS
500.0000 mg | ORAL_TABLET | Freq: Two times a day (BID) | ORAL | 0 refills | Status: AC | PRN
Start: 2021-07-23 — End: 2021-07-30

## 2021-07-23 NOTE — Assessment & Plan Note (Signed)
Lumbar back pain started after lifting something heavy.  We will treat with short course of prednisone and muscle relaxer.  Also pending lumbar picture.  Signs and symptoms reviewed as well as seek urgent or emergent health care.  Continue to monitor.  Pending results

## 2021-07-23 NOTE — Assessment & Plan Note (Signed)
Exam consistent with muscle strain bilateral paraspinal lumbar area.  We will place patient on a muscle relaxer.  Did review steroid precautions in regards to avoiding NSAIDs while therapy.  Patient acknowledged continue to monitor

## 2021-07-23 NOTE — Progress Notes (Signed)
Acute Office Visit  Subjective:    Patient ID: Cory Hoffman, male    DOB: 10-Jan-1976, 45 y.o.   MRN: [id number]  Chief Complaint  Patient presents with   Back Pain    Picked up a heavy engine and felt a sharp pain across the lower back. Pain is not radiating to any other areas. Dull/constant pain and at times sharp. Patient has taking Ibuprofen, Tylenol, uses heating pad     Patient is in today for Back pain  Started Sunday when he lifted a heavy part and felt the pain across his back. Has been using Ibuprofen, tylenol, and heating pad without relief. States his wife did massage him last night and felt knots on either side of his back  Tingling across the back but no numbness, weakness, or B&B involvement  Driving makes it worse (sitting) Mainly a dull pressure pain all the time with intermittent sharp pain   No history of back surgeries  Past Medical History:  Diagnosis Date   Chronic headaches    Frequent headaches    HTN (hypertension)    Hyperlipidemia    Migraines     Past Surgical History:  Procedure Laterality Date   BACK SURGERY     KNEE ARTHROSCOPY     WISDOM TOOTH EXTRACTION      Family History  Problem Relation Age of Onset   Heart disease Mother    Cancer - Lung Mother    Heart disease Father    Stroke Paternal Grandmother    Heart attack Paternal Grandfather        Mid 45s    Social History   Socioeconomic History   Marital status: Married    Spouse name: Not on file   Number of children: 4   Years of education: 16   Highest education level: Not on file  Occupational History   Occupation: Air cabin crew  Tobacco Use   Smoking status: Every Day    Packs/day: 1.00    Years: 15.00    Pack years: 15.00    Types: Cigarettes   Smokeless tobacco: Never  Vaping Use   Vaping Use: Never used  Substance and Sexual Activity   Alcohol use: Yes    Alcohol/week: 0.0 standard drinks    Comment: up to 3-4 drinks in a given setting  sporadically.   Drug use: No   Sexual activity: Yes  Other Topics Concern   Not on file  Social History Narrative   Fun/Hobby: Racing, drag racing.    Social Determinants of Health   Financial Resource Strain: Not on file  Food Insecurity: Not on file  Transportation Needs: Not on file  Physical Activity: Not on file  Stress: Not on file  Social Connections: Not on file  Intimate Partner Violence: Not on file    Outpatient Medications Prior to Visit  Medication Sig Dispense Refill   omeprazole (PRILOSEC) 20 MG capsule TAKE 1 CAPSULE(20 MG) BY MOUTH DAILY 90 capsule 3   pravastatin (PRAVACHOL) 20 MG tablet Take 1 tablet (20 mg total) by mouth daily. At night. 90 tablet 1   zolpidem (AMBIEN) 10 MG tablet Take 1 tablet (10 mg total) by mouth at bedtime as needed for sleep. 90 tablet 1   No facility-administered medications prior to visit.    No Known Allergies  Review of Systems  Constitutional:  Negative for chills and fever.  Respiratory:  Negative for cough and shortness of breath.   Cardiovascular:  Negative for chest  pain.  Gastrointestinal:  Negative for diarrhea, nausea and vomiting.  Genitourinary:        Negative B&B   Musculoskeletal:  Positive for back pain.  Neurological:  Positive for numbness (tingling). Negative for weakness.      Objective:    Physical Exam Vitals and nursing note reviewed.  Constitutional:      Appearance: Normal appearance.  Cardiovascular:     Rate and Rhythm: Normal rate and regular rhythm.     Pulses:          Posterior tibial pulses are 1+ on the right side and 1+ on the left side.  Pulmonary:     Effort: Pulmonary effort is normal.     Breath sounds: Normal breath sounds.  Abdominal:     General: Bowel sounds are normal.  Musculoskeletal:     Lumbar back: Spasms, tenderness and bony tenderness present. Negative right straight leg raise test and negative left straight leg raise test.       Back:     Comments: Bilateral  lower extremity strength 5/5  Neurological:     General: No focal deficit present.     Mental Status: He is alert.     Motor: No weakness.     Gait: Gait normal.     Deep Tendon Reflexes: Reflexes normal.     Reflex Scores:      Patellar reflexes are 2+ on the right side and 2+ on the left side. Psychiatric:        Mood and Affect: Mood normal.        Behavior: Behavior normal.        Thought Content: Thought content normal.        Judgment: Judgment normal.    BP (!) 144/96   Pulse 75   Temp (!) 97.3 F (36.3 C)   Resp 14   Ht _0  (1.778 m)   Wt 203 lb 4 oz (92.2 kg)   SpO2 98%   BMI 29.16 kg/m  Wt Readings from Last 3 Encounters:  07/23/21 203 lb 4 oz (92.2 kg)  04/17/21 200 lb (90.7 kg)  04/07/21 200 lb 12.8 oz (91.1 kg)    Health Maintenance Due  Topic Date Due   Pneumococcal Vaccine 56-65 Years old (1 - PCV) Never done   HIV Screening  Never done   Hepatitis C Screening  Never done   INFLUENZA VACCINE  Never done    There are no preventive care reminders to display for this patient.   No results found for: TSH Lab Results  Component Value Date   WBC 8.7 03/05/2021   HGB 16.7 03/05/2021   HCT 47.5 03/05/2021   MCV 87 03/05/2021   PLT 270 03/05/2021   Lab Results  Component Value Date   NA 142 03/05/2021   K 4.5 03/05/2021   CO2 24 03/05/2021   GLUCOSE 90 03/05/2021   BUN 8 03/05/2021   CREATININE 0.81 03/05/2021   BILITOT 0.4 03/05/2021   ALKPHOS 83 03/05/2021   AST 14 03/05/2021   ALT 22 03/05/2021   PROT 6.5 03/05/2021   ALBUMIN 4.5 03/05/2021   CALCIUM 9.5 03/05/2021   EGFR 113 03/05/2021   GFR 81.50 03/10/2018   Lab Results  Component Value Date   CHOL 190 03/05/2021   Lab Results  Component Value Date   HDL 34 (L) 03/05/2021   Lab Results  Component Value Date   LDLCALC 114 (H) 03/05/2021   Lab Results  Component Value  Date   TRIG 241 (H) 03/05/2021   Lab Results  Component Value Date   CHOLHDL 5.6 (H) 03/05/2021    No results found for: HGBA1C     Assessment & Plan:   Problem List Items Addressed This Visit       Musculoskeletal and Integument   Strain of lumbar region - Primary    Exam consistent with muscle strain bilateral paraspinal lumbar area.  We will place patient on a muscle relaxer.  Did review steroid precautions in regards to avoiding NSAIDs while therapy.  Patient acknowledged continue to monitor      Relevant Medications   methocarbamol (ROBAXIN) 500 MG tablet     Other   Acute bilateral low back pain without sciatica    Lumbar back pain started after lifting something heavy.  We will treat with short course of prednisone and muscle relaxer.  Also pending lumbar picture.  Signs and symptoms reviewed as well as seek urgent or emergent health care.  Continue to monitor.  Pending results      Relevant Medications   methocarbamol (ROBAXIN) 500 MG tablet   predniSONE (DELTASONE) 20 MG tablet   Other Relevant Orders   DG Lumbar Spine Complete     No orders of the defined types were placed in this encounter.  This visit occurred during the SARS-CoV-2 public health emergency.  Safety protocols were in place, including screening questions prior to the visit, additional usage of staff PPE, and extensive cleaning of exam room while observing appropriate contact time as indicated for disinfecting solutions.   Romilda Garret, NP

## 2021-07-23 NOTE — Patient Instructions (Signed)
Nice to see you today Avoid NSAIDs like ibuprofen, aleve, motrin, naproxen, BC/Goody powders while on the prednisone Will be in touch with xray results Follow up if symptoms fail to improve or get worse

## 2021-07-31 DIAGNOSIS — M25571 Pain in right ankle and joints of right foot: Secondary | ICD-10-CM | POA: Diagnosis not present

## 2021-07-31 DIAGNOSIS — M722 Plantar fascial fibromatosis: Secondary | ICD-10-CM | POA: Diagnosis not present

## 2021-10-06 ENCOUNTER — Other Ambulatory Visit: Payer: Self-pay | Admitting: Family Medicine

## 2021-10-06 DIAGNOSIS — G4700 Insomnia, unspecified: Secondary | ICD-10-CM

## 2021-10-06 NOTE — Telephone Encounter (Signed)
Refill request for Ambien 10 mg  LR 04/17/21, #90, 1 rf LOV  04/17/21 FOV none scheduled.   Please review and advise.  Thanks. Dm/cma

## 2021-10-12 ENCOUNTER — Encounter: Payer: Self-pay | Admitting: Family Medicine

## 2021-10-12 ENCOUNTER — Other Ambulatory Visit: Payer: Self-pay

## 2021-10-12 ENCOUNTER — Ambulatory Visit: Payer: BC Managed Care – PPO | Admitting: Family Medicine

## 2021-10-12 VITALS — BP 152/80 | HR 66 | Temp 97.0°F | Ht 70.0 in | Wt 203.0 lb

## 2021-10-12 DIAGNOSIS — H6983 Other specified disorders of Eustachian tube, bilateral: Secondary | ICD-10-CM

## 2021-10-12 DIAGNOSIS — J019 Acute sinusitis, unspecified: Secondary | ICD-10-CM | POA: Diagnosis not present

## 2021-10-12 MED ORDER — CLARITHROMYCIN ER 500 MG PO TB24: 1000.0000 mg | ORAL_TABLET | Freq: Every day | ORAL | 0 refills | Status: AC

## 2021-10-12 NOTE — Progress Notes (Signed)
Established Patient Office Visit  Subjective:  Patient ID: Cory Hoffman, male    DOB: Nov 08, 1975  Age: 46 y.o. MRN: [id number]  CC:  Chief Complaint  Patient presents with   Sinus Problem    Head congestion, stuffy nose, earache in both ears symptoms x 1 week. OTC meds not helping.     HPI Cory Hoffman presents for evaluation treatment of a 10-day history of malaise and fatigue with nasal congestion postnasal drip.  It is not clearing.  There have been no fevers or chills.  Ears have been congested.  He has been taking Allegra, DayQuil and NyQuil.  Recently started on nasal decongestant.  Tolerating pravastatin.  Past Medical History:  Diagnosis Date   Chronic headaches    Frequent headaches    HTN (hypertension)    Hyperlipidemia    Migraines     Past Surgical History:  Procedure Laterality Date   BACK SURGERY     KNEE ARTHROSCOPY     WISDOM TOOTH EXTRACTION      Family History  Problem Relation Age of Onset   Heart disease Mother    Cancer - Lung Mother    Heart disease Father    Stroke Paternal Grandmother    Heart attack Paternal Grandfather        Mid 90s    Social History   Socioeconomic History   Marital status: Married    Spouse name: Not on file   Number of children: 4   Years of education: 16   Highest education level: Not on file  Occupational History   Occupation: Air cabin crew  Tobacco Use   Smoking status: Every Day    Packs/day: 1.00    Years: 15.00    Pack years: 15.00    Types: Cigarettes   Smokeless tobacco: Never  Vaping Use   Vaping Use: Never used  Substance and Sexual Activity   Alcohol use: Yes    Alcohol/week: 0.0 standard drinks    Comment: up to 3-4 drinks in a given setting sporadically.   Drug use: No   Sexual activity: Yes  Other Topics Concern   Not on file  Social History Narrative   Fun/Hobby: Racing, drag racing.    Social Determinants of Health   Financial Resource Strain: Not on file  Food  Insecurity: Not on file  Transportation Needs: Not on file  Physical Activity: Not on file  Stress: Not on file  Social Connections: Not on file  Intimate Partner Violence: Not on file    Outpatient Medications Prior to Visit  Medication Sig Dispense Refill   omeprazole (PRILOSEC) 20 MG capsule TAKE 1 CAPSULE(20 MG) BY MOUTH DAILY 90 capsule 3   pravastatin (PRAVACHOL) 20 MG tablet Take 1 tablet (20 mg total) by mouth daily. At night. 90 tablet 1   zolpidem (AMBIEN) 10 MG tablet TAKE 1 TABLET BY MOUTH AT BEDTIME AS NEEDED FOR SLEEP 90 tablet 0   No facility-administered medications prior to visit.    No Known Allergies  ROS Review of Systems  Constitutional:  Positive for fatigue. Negative for chills, diaphoresis, fever and unexpected weight change.  HENT:  Positive for congestion, postnasal drip, sinus pain and sore throat. Negative for rhinorrhea.   Eyes:  Negative for photophobia and visual disturbance.  Respiratory:  Negative for cough and wheezing.   Cardiovascular: Negative.   Gastrointestinal: Negative.   Endocrine: Negative for polyphagia and polyuria.  Musculoskeletal:  Negative for arthralgias and myalgias.  Allergic/Immunologic: Negative  for immunocompromised state.  Neurological:  Negative for speech difficulty and weakness.  Psychiatric/Behavioral: Negative.       Objective:    Physical Exam Vitals and nursing note reviewed.  Constitutional:      General: He is not in acute distress.    Appearance: Normal appearance. He is not ill-appearing, toxic-appearing or diaphoretic.  HENT:     Head: Normocephalic and atraumatic.     Right Ear: No middle ear effusion. Tympanic membrane is retracted. Tympanic membrane is not injected, scarred or erythematous.     Left Ear:  No middle ear effusion. Tympanic membrane is retracted. Tympanic membrane is not injected, scarred or erythematous.     Mouth/Throat:     Mouth: Mucous membranes are moist.     Pharynx: Oropharynx  is clear. No oropharyngeal exudate or posterior oropharyngeal erythema.  Eyes:     General: No scleral icterus.       Right eye: No discharge.        Left eye: No discharge.     Extraocular Movements: Extraocular movements intact.     Conjunctiva/sclera: Conjunctivae normal.     Pupils: Pupils are equal, round, and reactive to light.  Cardiovascular:     Rate and Rhythm: Normal rate and regular rhythm.  Pulmonary:     Effort: Pulmonary effort is normal.     Breath sounds: Normal breath sounds.  Musculoskeletal:     Cervical back: No rigidity or tenderness.     Right lower leg: No edema.     Left lower leg: No edema.  Lymphadenopathy:     Cervical: No cervical adenopathy.  Skin:    General: Skin is warm and dry.  Neurological:     Mental Status: He is alert and oriented to person, place, and time.  Psychiatric:        Mood and Affect: Mood normal.        Behavior: Behavior normal.    BP (!) 152/80 (BP Location: Right Arm, Patient Position: Sitting, Cuff Size: Large)    Pulse 66    Temp (!) 97 F (36.1 C) (Temporal)    Ht '5\' 10"'  (1.778 m)    Wt 203 lb (92.1 kg)    SpO2 97%    BMI 29.13 kg/m  Wt Readings from Last 3 Encounters:  10/12/21 203 lb (92.1 kg)  07/23/21 203 lb 4 oz (92.2 kg)  04/17/21 200 lb (90.7 kg)     Health Maintenance Due  Topic Date Due   HIV Screening  Never done   Hepatitis C Screening  Never done    There are no preventive care reminders to display for this patient.  No results found for: TSH Lab Results  Component Value Date   WBC 8.7 03/05/2021   HGB 16.7 03/05/2021   HCT 47.5 03/05/2021   MCV 87 03/05/2021   PLT 270 03/05/2021   Lab Results  Component Value Date   NA 142 03/05/2021   K 4.5 03/05/2021   CO2 24 03/05/2021   GLUCOSE 90 03/05/2021   BUN 8 03/05/2021   CREATININE 0.81 03/05/2021   BILITOT 0.4 03/05/2021   ALKPHOS 83 03/05/2021   AST 14 03/05/2021   ALT 22 03/05/2021   PROT 6.5 03/05/2021   ALBUMIN 4.5 03/05/2021    CALCIUM 9.5 03/05/2021   EGFR 113 03/05/2021   GFR 81.50 03/10/2018   Lab Results  Component Value Date   CHOL 190 03/05/2021   Lab Results  Component Value Date  HDL 34 (L) 03/05/2021   Lab Results  Component Value Date   LDLCALC 114 (H) 03/05/2021   Lab Results  Component Value Date   TRIG 241 (H) 03/05/2021   Lab Results  Component Value Date   CHOLHDL 5.6 (H) 03/05/2021   No results found for: HGBA1C    Assessment & Plan:   Problem List Items Addressed This Visit       Respiratory   Acute non-recurrent sinusitis - Primary   Relevant Medications   clarithromycin (BIAXIN XL) 500 MG 24 hr tablet     Nervous and Auditory   Dysfunction of both eustachian tubes   Relevant Medications   clarithromycin (BIAXIN XL) 500 MG 24 hr tablet    Meds ordered this encounter  Medications   clarithromycin (BIAXIN XL) 500 MG 24 hr tablet    Sig: Take 2 tablets (1,000 mg total) by mouth daily for 10 days.    Dispense:  20 tablet    Refill:  0    Follow-up: Return Hold Allegra while taking biaxin..  Eustachian tube exercises demonstrated.  Biaxin for antibacterial and anti-inflammatory properties.  Believe the blood pressure is elevated secondary to recent dosing with DayQuil.  We will schedule routine follow-up of cholesterol and health check in the next month or so.  Libby Maw, MD

## 2021-11-06 ENCOUNTER — Ambulatory Visit: Payer: BC Managed Care – PPO | Admitting: Family Medicine

## 2021-11-06 ENCOUNTER — Encounter: Payer: Self-pay | Admitting: Family Medicine

## 2021-11-06 ENCOUNTER — Other Ambulatory Visit: Payer: Self-pay

## 2021-11-06 VITALS — BP 132/80 | HR 82 | Temp 97.5°F | Ht 70.0 in | Wt 205.2 lb

## 2021-11-06 DIAGNOSIS — F17210 Nicotine dependence, cigarettes, uncomplicated: Secondary | ICD-10-CM | POA: Diagnosis not present

## 2021-11-06 DIAGNOSIS — R0600 Dyspnea, unspecified: Secondary | ICD-10-CM

## 2021-11-06 DIAGNOSIS — I10 Essential (primary) hypertension: Secondary | ICD-10-CM | POA: Diagnosis not present

## 2021-11-06 MED ORDER — VARENICLINE TARTRATE 1 MG PO TABS: 1.0000 mg | ORAL_TABLET | Freq: Two times a day (BID) | ORAL | 2 refills | Status: DC

## 2021-11-06 MED ORDER — VARENICLINE TARTRATE 0.5 MG PO TABS: ORAL_TABLET | ORAL | 0 refills | Status: AC

## 2021-11-06 MED ORDER — LISINOPRIL 10 MG PO TABS: 10.0000 mg | ORAL_TABLET | Freq: Every day | ORAL | 3 refills | Status: DC

## 2021-11-06 NOTE — Progress Notes (Signed)
?[facility] ?[facility] ?[address] ?[place] [address] ?Dept: [phone] ?Dept Fax: [phone] ? ?Office Visit ? ?Subjective:  ? ? Patient ID: Cory Hoffman, male    DOB: 1975/09/16, 46 y.o..   MRN: [id number] ? ?Chief Complaint  ?Patient presents with  ? Acute Visit  ?  C/o still having SOB, congestion, fatigue x 1 month.  No OTC meds taken.   Elevated BP 147/104 last night.  ? ? ?History of Present Illness: ? ?Patient is in today complaining of a 54-month history of dyspnea, fatigue, and occasional feeling like he struggles to get words out. He was seen about a month ago with a sinus infection and was treated with a course of antibiotics. He has some occasional cough, but has not noted morning cough. He notes the episodes of dyspnea can occur at rest or with activity. However, he denies any orthopnea or PND. He has had mild congestion. He denies any swelling in his ankles. He does have a history of tobacco use. He has smoked 1 ppd for the past 10 years. He had been a smoker when he was younger, but quit. He had a relapse after a particularly stressful event. He does desire to quit, but has not taken action on this. Cory Hoffman has also had an elevated blood pressure in the past. he was previously treated for hypertension up until about 4 years ago. He was doing better with stress in his life, so stopped his med. He notes he had a pressure of 147/104 last night at home. ? ?Past Medical History: ?Patient Active Problem List  ? Diagnosis Date Noted  ? Acute non-recurrent sinusitis 10/12/2021  ? Strain of lumbar region 07/23/2021  ? Acute bilateral low back pain without sciatica 07/23/2021  ? History of UTI 04/17/2021  ? High risk medications (not anticoagulants) long-term use 04/17/2021  ? Mixed hyperlipidemia 04/07/2021  ? Acute gastritis without hemorrhage 07/04/2019  ? Influenza A 10/23/2018  ? Dysfunction of both eustachian tubes 06/20/2018  ? Arthritis of knee  03/10/2018  ? Epigastric pain 03/10/2018  ? Chronic pain of left knee 02/22/2018  ? Nicotine dependence, cigarettes, uncomplicated 01/19/2018  ? Insomnia 08/26/2017  ? Anxiety 08/26/2017  ? Hypercholesteremia 08/26/2017  ? Migraine without status migrainosus, not intractable 08/26/2017  ? Essential hypertension 03/14/2017  ? Encounter for smoking cessation counseling 04/07/2016  ? OSA (obstructive sleep apnea) 09/24/2015  ? Vitamin D deficiency 05/26/2015  ? Gastroesophageal reflux disease without esophagitis 05/11/2015  ? Obesity (BMI 30-39.9) 05/11/2015  ? Family history of premature CAD 04/07/2015  ? ?Past Surgical History:  ?Procedure Laterality Date  ? BACK SURGERY    ? KNEE ARTHROSCOPY    ? WISDOM TOOTH EXTRACTION    ? ?Family History  ?Problem Relation Age of Onset  ? Heart disease Mother   ? Cancer - Lung Mother   ? Heart disease Father   ? Stroke Paternal Grandmother   ? Heart attack Paternal Grandfather   ?     Mid 50s  ? ?Outpatient Medications Prior to Visit  ?Medication Sig Dispense Refill  ? omeprazole (PRILOSEC) 20 MG capsule TAKE 1 CAPSULE(20 MG) BY MOUTH DAILY 90 capsule 3  ? pravastatin (PRAVACHOL) 20 MG tablet Take 1 tablet (20 mg total) by mouth daily. At night. 90 tablet 1  ? zolpidem (AMBIEN) 10 MG tablet TAKE 1 TABLET BY MOUTH AT BEDTIME AS NEEDED FOR SLEEP 90 tablet 0  ? ?No facility-administered medications prior to visit.  ? ?  No Known Allergies ?   ?Objective:  ? ?Today's Vitals  ? 11/06/21 1524  ?BP: 132/80  ?Pulse: 82  ?Temp: (!) 97.5 ?F (36.4 ?C)  ?TempSrc: Temporal  ?SpO2: 98%  ?Weight: 205 lb 3.2 oz (93.1 kg)  ?Height: 5\' 10"  (1.778 m)  ? ?Body mass index is 29.44 kg/m?.  ? ?General: Well developed, well nourished. No acute distress. ?HEENT: Normocephalic, non-traumatic. External ears normal. EAC and TMs normal bilaterally. Nose   ? clear without congestion or rhinorrhea. Mucous membranes moist. Oropharynx clear. Good dentition. ?Neck: Supple. No lymphadenopathy. No  thyromegaly. ?Lungs: Clear to auscultation bilaterally. No wheezing, rales or rhonchi. ?CV: RRR without murmurs or rubs. Pulses 2+ bilaterally. ?Extremities: No edema noted. ?Psych: Alert and oriented. Normal mood and affect. ? ?Health Maintenance Due  ?Topic Date Due  ? HIV Screening  Never done  ? Hepatitis C Screening  Never done  ?   ?Assessment & Plan:  ? ?1. Dyspnea, unspecified type ?The etiology is unclear. He does not have overt signs of heart failure or other cardiac disease. In light of his smoking history, I recommend we start by having him evaluated by pulmonology I think PFTs would help to clarify if this could be an underlying smoking related issue. ? ?- Ambulatory referral to Pulmonology ? ?2. Essential hypertension ?Although Cory Hoffman blood pressure today is at Stage 1 hypertension levels, he has had other recent pressures that are Stage 2. I recommend we start him on an ACE-I. This would help if there was an underlying cardiac issue contributing to his dyspnea. ? ?- lisinopril (ZESTRIL) 10 MG tablet; Take 1 tablet (10 mg total) by mouth daily.  Dispense: 90 tablet; Refill: 3 ? ?3. Nicotine dependence, cigarettes, uncomplicated ?I advised Cory Hoffman to quit smoking. He would like to try Chantix. ? ?- varenicline (CHANTIX) 0.5 MG tablet; Take 1 tablet (0.5 mg total) by mouth daily for 3 days, THEN 1 tablet (0.5 mg total) 2 (two) times daily for 4 days.  Dispense: 11 tablet; Refill: 0 ?- varenicline (CHANTIX) 1 MG tablet; Take 1 tablet (1 mg total) by mouth 2 (two) times daily.  Dispense: 60 tablet; Refill: 2 ? ?Return in about 4 weeks (around 12/04/2021) for Reassessment with Dr. 12/06/2021 or Dr. Doreene Burke.  ? ?Veto Kemps, MD ?

## 2021-12-07 ENCOUNTER — Telehealth: Payer: Self-pay | Admitting: Family Medicine

## 2021-12-07 NOTE — Telephone Encounter (Signed)
Pharmacy updated in chart

## 2021-12-07 NOTE — Telephone Encounter (Signed)
Pt is wanting all of his scripts from this point moving forward to be sent to [facility] Address: [address], [place], [place] [address]   Phone: [phone]. He has moved and is unable to change in [place].  ?

## 2021-12-08 ENCOUNTER — Other Ambulatory Visit: Payer: Self-pay | Admitting: Family Medicine

## 2021-12-08 ENCOUNTER — Encounter: Payer: Self-pay | Admitting: Family Medicine

## 2021-12-08 DIAGNOSIS — E782 Mixed hyperlipidemia: Secondary | ICD-10-CM

## 2021-12-26 ENCOUNTER — Other Ambulatory Visit: Payer: Self-pay | Admitting: Family Medicine

## 2021-12-26 DIAGNOSIS — G4700 Insomnia, unspecified: Secondary | ICD-10-CM

## 2021-12-28 MED ORDER — ZOLPIDEM TARTRATE 10 MG PO TABS: 10.0000 mg | ORAL_TABLET | Freq: Every evening | ORAL | 0 refills | Status: DC | PRN

## 2022-02-11 ENCOUNTER — Other Ambulatory Visit: Payer: Self-pay | Admitting: Family Medicine

## 2022-02-11 DIAGNOSIS — F17210 Nicotine dependence, cigarettes, uncomplicated: Secondary | ICD-10-CM

## 2022-03-29 ENCOUNTER — Ambulatory Visit (INDEPENDENT_AMBULATORY_CARE_PROVIDER_SITE_OTHER): Payer: 59 | Admitting: Family Medicine

## 2022-03-29 ENCOUNTER — Encounter: Payer: Self-pay | Admitting: Family Medicine

## 2022-03-29 VITALS — BP 124/82 | HR 104 | Temp 97.4°F | Ht 70.0 in | Wt 193.0 lb

## 2022-03-29 DIAGNOSIS — Z0000 Encounter for general adult medical examination without abnormal findings: Secondary | ICD-10-CM

## 2022-03-29 DIAGNOSIS — G4700 Insomnia, unspecified: Secondary | ICD-10-CM | POA: Diagnosis not present

## 2022-03-29 DIAGNOSIS — K219 Gastro-esophageal reflux disease without esophagitis: Secondary | ICD-10-CM

## 2022-03-29 DIAGNOSIS — I10 Essential (primary) hypertension: Secondary | ICD-10-CM

## 2022-03-29 DIAGNOSIS — E782 Mixed hyperlipidemia: Secondary | ICD-10-CM | POA: Diagnosis not present

## 2022-03-29 DIAGNOSIS — J339 Nasal polyp, unspecified: Secondary | ICD-10-CM

## 2022-03-29 DIAGNOSIS — J330 Polyp of nasal cavity: Secondary | ICD-10-CM | POA: Insufficient documentation

## 2022-03-29 MED ORDER — LISINOPRIL 10 MG PO TABS: 10.0000 mg | ORAL_TABLET | Freq: Every day | ORAL | 3 refills | Status: DC

## 2022-03-29 MED ORDER — ZOLPIDEM TARTRATE 10 MG PO TABS: 10.0000 mg | ORAL_TABLET | Freq: Every evening | ORAL | 0 refills | Status: DC | PRN

## 2022-03-29 MED ORDER — OMEPRAZOLE 20 MG PO CPDR: DELAYED_RELEASE_CAPSULE | ORAL | 3 refills | Status: DC

## 2022-03-29 MED ORDER — PRAVASTATIN SODIUM 20 MG PO TABS: ORAL_TABLET | ORAL | 1 refills | Status: DC

## 2022-03-29 NOTE — Progress Notes (Signed)
Established Patient Office Visit  Subjective   Patient ID: Cory Hoffman, male    DOB: 03-28-1976  Age: 46 y.o. MRN: [id number]  Chief Complaint  Patient presents with   Annual Exam    CPE, no concerns. Patient not fasting.     HPI for physical exam and follow-up of hypertension, elevated cholesterol and insomnia.  Quit smoking 2 months ago.  Finally has an pends of chronic nasal congestion and nasal polyposis.  Surgery is scheduled in a few weeks.  Blood pressure well-controlled with lisinopril.  Continues pravastatin for elevated cholesterol.  Taking zolpidem nightly for insomnia.  Has been able to lose some weight.  Feeling much better.  Has regular dental care.    Review of Systems  Constitutional: Negative.   HENT:  Positive for congestion.   Eyes:  Negative for blurred vision, discharge and redness.  Respiratory: Negative.    Cardiovascular: Negative.   Gastrointestinal:  Negative for abdominal pain.  Genitourinary: Negative.   Musculoskeletal: Negative.  Negative for myalgias.  Skin:  Negative for rash.  Neurological:  Negative for tingling, loss of consciousness and weakness.  Endo/Heme/Allergies:  Negative for polydipsia.      Objective:     BP 124/82 (BP Location: Right Arm, Patient Position: Sitting, Cuff Size: Large)   Pulse (!) 104   Temp (!) 97.4 F (36.3 C) (Temporal)   Ht 5\' 10"  (1.778 m)   Wt 193 lb (87.5 kg)   SpO2 98%   BMI 27.69 kg/m  BP Readings from Last 3 Encounters:  03/29/22 124/82  11/06/21 132/80  10/12/21 (!) 152/80   Wt Readings from Last 3 Encounters:  03/29/22 193 lb (87.5 kg)  11/06/21 205 lb 3.2 oz (93.1 kg)  10/12/21 203 lb (92.1 kg)      Physical Exam Constitutional:      General: He is not in acute distress.    Appearance: Normal appearance. He is not ill-appearing, toxic-appearing or diaphoretic.  HENT:     Head: Normocephalic and atraumatic.     Right Ear: Tympanic membrane, ear canal and external ear normal.      Left Ear: Tympanic membrane, ear canal and external ear normal.     Mouth/Throat:     Mouth: Mucous membranes are moist.     Pharynx: Oropharynx is clear. No oropharyngeal exudate or posterior oropharyngeal erythema.  Eyes:     General: No scleral icterus.       Right eye: No discharge.        Left eye: No discharge.     Extraocular Movements: Extraocular movements intact.     Conjunctiva/sclera: Conjunctivae normal.     Pupils: Pupils are equal, round, and reactive to light.  Cardiovascular:     Rate and Rhythm: Normal rate and regular rhythm.  Pulmonary:     Effort: Pulmonary effort is normal. No respiratory distress.     Breath sounds: Normal breath sounds.  Abdominal:     General: Bowel sounds are normal. There is no distension.     Tenderness: There is no abdominal tenderness. There is no guarding.  Musculoskeletal:     Cervical back: No rigidity or tenderness.  Skin:    General: Skin is warm and dry.  Neurological:     Mental Status: He is alert and oriented to person, place, and time.  Psychiatric:        Mood and Affect: Mood normal.        Behavior: Behavior normal.  No results found for any visits on 03/29/22.    The 10-year ASCVD risk score (Arnett DK, et al., 2019) is: 2.7%    Assessment & Plan:   Problem List Items Addressed This Visit       Cardiovascular and Mediastinum   Essential hypertension   Relevant Medications   lisinopril (ZESTRIL) 10 MG tablet   pravastatin (PRAVACHOL) 20 MG tablet   Other Relevant Orders   CBC   Comprehensive metabolic panel   Urinalysis, Routine w reflex microscopic     Respiratory   Nasal polyposis     Digestive   Gastroesophageal reflux disease without esophagitis   Relevant Medications   omeprazole (PRILOSEC) 20 MG capsule     Other   Insomnia   Relevant Medications   zolpidem (AMBIEN) 10 MG tablet   Healthcare maintenance - Primary   Mixed hyperlipidemia   Relevant Medications   lisinopril  (ZESTRIL) 10 MG tablet   pravastatin (PRAVACHOL) 20 MG tablet   Other Relevant Orders   Comprehensive metabolic panel   Lipid panel    Return in about 6 months (around 09/29/2022).  Will return fasting for above ordered blood work.  Good luck with upcoming sinus surgery.  Continue all medicines as above.  Information given on health maintenance.  Mliss Sax, MD

## 2022-04-05 ENCOUNTER — Other Ambulatory Visit: Payer: 59

## 2022-04-05 DIAGNOSIS — I10 Essential (primary) hypertension: Secondary | ICD-10-CM

## 2022-04-05 DIAGNOSIS — E782 Mixed hyperlipidemia: Secondary | ICD-10-CM

## 2022-04-05 NOTE — Addendum Note (Signed)
Addended by: Varney Biles on: 04/05/2022 08:42 AM   Modules accepted: Orders

## 2022-04-06 LAB — CBC
Hematocrit: 46.2 % (ref 37.5–51.0)
Hemoglobin: 14.9 g/dL (ref 13.0–17.7)
MCH: 29.1 pg (ref 26.6–33.0)
MCHC: 32.3 g/dL (ref 31.5–35.7)
MCV: 90 fL (ref 79–97)
Platelets: 318 10*3/uL (ref 150–450)
RBC: 5.12 x10E6/uL (ref 4.14–5.80)
RDW: 12.6 % (ref 11.6–15.4)
WBC: 13 10*3/uL — ABNORMAL HIGH (ref 3.4–10.8)

## 2022-04-06 LAB — COMPREHENSIVE METABOLIC PANEL
ALT: 23 IU/L (ref 0–44)
AST: 23 IU/L (ref 0–40)
Albumin/Globulin Ratio: 1.7 (ref 1.2–2.2)
Albumin: 4.1 g/dL (ref 4.1–5.1)
Alkaline Phosphatase: 53 IU/L (ref 44–121)
BUN/Creatinine Ratio: 9 (ref 9–20)
BUN: 9 mg/dL (ref 6–24)
Bilirubin Total: <0.2 mg/dL (ref 0.0–1.2)
CO2: 21 mmol/L (ref 20–29)
Calcium: 9.1 mg/dL (ref 8.7–10.2)
Chloride: 101 mmol/L (ref 96–106)
Creatinine, Ser: 0.96 mg/dL (ref 0.76–1.27)
Globulin, Total: 2.4 g/dL (ref 1.5–4.5)
Glucose: 71 mg/dL (ref 70–99)
Potassium: 3.9 mmol/L (ref 3.5–5.2)
Sodium: 140 mmol/L (ref 134–144)
Total Protein: 6.5 g/dL (ref 6.0–8.5)
eGFR: 101 mL/min/{1.73_m2} (ref >59)

## 2022-04-06 LAB — URINALYSIS, ROUTINE W REFLEX MICROSCOPIC
Bilirubin, UA: NEGATIVE
Glucose, UA: NEGATIVE
Ketones, UA: NEGATIVE
Leukocytes,UA: NEGATIVE
Nitrite, UA: NEGATIVE
Protein,UA: NEGATIVE
RBC, UA: NEGATIVE
Specific Gravity, UA: 1.018 (ref 1.005–1.030)
Urobilinogen, Ur: 0.2 mg/dL (ref 0.2–1.0)
pH, UA: 6.5 (ref 5.0–7.5)

## 2022-04-06 LAB — LIPID PANEL
Chol/HDL Ratio: 4.5 ratio (ref 0.0–5.0)
Cholesterol, Total: 201 mg/dL — ABNORMAL HIGH (ref 100–199)
HDL: 45 mg/dL (ref >39)
LDL Chol Calc (NIH): 122 mg/dL — ABNORMAL HIGH (ref 0–99)
Triglycerides: 192 mg/dL — ABNORMAL HIGH (ref 0–149)
VLDL Cholesterol Cal: 34 mg/dL (ref 5–40)

## 2022-04-07 ENCOUNTER — Encounter: Payer: Self-pay | Admitting: Family Medicine

## 2022-04-12 NOTE — Addendum Note (Signed)
Addended by: Andrez Grime on: 04/12/2022 12:54 PM   Modules accepted: Level of Service

## 2022-06-10 ENCOUNTER — Other Ambulatory Visit: Payer: Self-pay

## 2022-06-10 DIAGNOSIS — K219 Gastro-esophageal reflux disease without esophagitis: Secondary | ICD-10-CM

## 2022-06-10 MED ORDER — OMEPRAZOLE 20 MG PO CPDR: DELAYED_RELEASE_CAPSULE | ORAL | 3 refills | Status: DC

## 2022-06-28 ENCOUNTER — Other Ambulatory Visit: Payer: Self-pay | Admitting: Family Medicine

## 2022-06-28 DIAGNOSIS — G4700 Insomnia, unspecified: Secondary | ICD-10-CM

## 2022-08-24 ENCOUNTER — Encounter: Payer: Self-pay | Admitting: Family Medicine

## 2022-09-27 ENCOUNTER — Other Ambulatory Visit: Payer: Self-pay | Admitting: Family Medicine

## 2022-09-27 DIAGNOSIS — G4700 Insomnia, unspecified: Secondary | ICD-10-CM

## 2022-09-29 ENCOUNTER — Other Ambulatory Visit: Payer: Self-pay

## 2022-10-27 ENCOUNTER — Encounter: Payer: Self-pay | Admitting: Family Medicine

## 2022-10-29 ENCOUNTER — Encounter: Payer: Self-pay | Admitting: Family Medicine

## 2022-10-29 ENCOUNTER — Ambulatory Visit: Payer: Managed Care, Other (non HMO) | Admitting: Family Medicine

## 2022-10-29 VITALS — BP 144/86 | HR 79 | Temp 98.2°F | Wt 195.4 lb

## 2022-10-29 DIAGNOSIS — E782 Mixed hyperlipidemia: Secondary | ICD-10-CM | POA: Diagnosis not present

## 2022-10-29 DIAGNOSIS — I10 Essential (primary) hypertension: Secondary | ICD-10-CM | POA: Diagnosis not present

## 2022-10-29 DIAGNOSIS — Z566 Other physical and mental strain related to work: Secondary | ICD-10-CM | POA: Insufficient documentation

## 2022-10-29 DIAGNOSIS — Z131 Encounter for screening for diabetes mellitus: Secondary | ICD-10-CM

## 2022-10-29 DIAGNOSIS — T887XXA Unspecified adverse effect of drug or medicament, initial encounter: Secondary | ICD-10-CM

## 2022-10-29 MED ORDER — OLMESARTAN MEDOXOMIL 20 MG PO TABS: 20.0000 mg | ORAL_TABLET | Freq: Every day | ORAL | 1 refills | Status: DC

## 2022-10-29 MED ORDER — ESCITALOPRAM OXALATE 10 MG PO TABS: 10.0000 mg | ORAL_TABLET | Freq: Every day | ORAL | 2 refills | Status: DC

## 2022-10-29 NOTE — Progress Notes (Signed)
Established Patient Office Visit   Subjective:  Patient ID: Cory Hoffman, male    DOB: [birth year]  Age: 47 y.o. MRN: [id number]  Chief Complaint  Patient presents with   Medical Management of Chronic Issues    B/p and stress concerns. 150/100-110 are readings at home. Fasting    HPI Encounter Diagnoses  Name Primary?   Stress at work Yes   Medication side effect    Essential hypertension    Mixed hyperlipidemia    Screening for diabetes mellitus (DM)    For follow-up of above.  Dealing with major work stress.  Anxious and irritable.  It is affecting his home monitor.  Wife's been understanding.  Issues with employees.  Has developed an intermittent dry irritant cough.  No regular exercise at this time.  Continues with pravastatin for elevated cholesterol.   Review of Systems  Constitutional: Negative.   HENT: Negative.    Eyes:  Negative for blurred vision, discharge and redness.  Respiratory: Negative.    Cardiovascular: Negative.   Gastrointestinal:  Negative for abdominal pain.  Genitourinary: Negative.   Musculoskeletal: Negative.  Negative for myalgias.  Skin:  Negative for rash.  Neurological:  Negative for tingling, loss of consciousness and weakness.  Endo/Heme/Allergies:  Negative for polydipsia.  Psychiatric/Behavioral:  Negative for depression. The patient is nervous/anxious.       10/29/2022    8:45 AM 03/29/2022    3:12 PM 10/12/2021    4:05 PM  Depression screen PHQ 2/9  Decreased Interest 0 0 0  Down, Depressed, Hopeless 0 0 0  PHQ - 2 Score 0 0 0  Altered sleeping 2    Tired, decreased energy 2    Change in appetite 0    Feeling bad or failure about yourself  0    Trouble concentrating 1    Moving slowly or fidgety/restless 1    Suicidal thoughts 0    PHQ-9 Score 6    Difficult doing work/chores Somewhat difficult         Current Outpatient Medications:    escitalopram (LEXAPRO) 10 MG tablet, Take 1 tablet (10 mg total) by mouth daily.,  Disp: 30 tablet, Rfl: 2   fluticasone (FLONASE) 50 MCG/ACT nasal spray, Administer 1 spray in each nostril 2 times daily., Disp: , Rfl:    levocetirizine (XYZAL) 5 MG tablet, Take 1 tablet by mouth daily., Disp: , Rfl:    loratadine (CLARITIN) 10 MG tablet, Take 1 tablet by mouth daily., Disp: , Rfl:    olmesartan (BENICAR) 20 MG tablet, Take 1 tablet (20 mg total) by mouth daily., Disp: 90 tablet, Rfl: 1   omeprazole (PRILOSEC) 20 MG capsule, TAKE 1 CAPSULE(20 MG) BY MOUTH DAILY, Disp: 90 capsule, Rfl: 3   pravastatin (PRAVACHOL) 20 MG tablet, TAKE 1 TABLET(20 MG) BY MOUTH DAILY AT NIGHT, Disp: 90 tablet, Rfl: 1   zolpidem (AMBIEN) 10 MG tablet, TAKE 1 TABLET(10 MG) BY MOUTH AT BEDTIME AS NEEDED FOR SLEEP, Disp: 90 tablet, Rfl: 0   Objective:     BP (!) 144/86 (BP Location: Left Arm, Patient Position: Sitting, Cuff Size: Large)   Pulse 79   Temp 98.2 F (36.8 C) (Temporal)   Wt 195 lb 6.4 oz (88.6 kg)   SpO2 99%   BMI 28.04 kg/m  BP Readings from Last 3 Encounters:  10/29/22 (!) 144/86  03/29/22 124/82  11/06/21 132/80   Wt Readings from Last 3 Encounters:  10/29/22 195 lb 6.4 oz (88.6 kg)  03/29/22 193 lb (87.5 kg)  11/06/21 205 lb 3.2 oz (93.1 kg)      Physical Exam Constitutional:      General: He is not in acute distress.    Appearance: Normal appearance. He is not ill-appearing, toxic-appearing or diaphoretic.  HENT:     Head: Normocephalic and atraumatic.     Right Ear: External ear normal.     Left Ear: External ear normal.  Eyes:     General: No scleral icterus.       Right eye: No discharge.        Left eye: No discharge.     Extraocular Movements: Extraocular movements intact.     Conjunctiva/sclera: Conjunctivae normal.  Pulmonary:     Effort: Pulmonary effort is normal. No respiratory distress.  Skin:    General: Skin is warm and dry.  Neurological:     Mental Status: He is alert and oriented to person, place, and time.  Psychiatric:        Mood and  Affect: Mood normal.        Behavior: Behavior normal.      No results found for any visits on 10/29/22.    The 10-year ASCVD risk score (Arnett DK, et al., 2019) is: 3%    Assessment & Plan:   Stress at work -     Escitalopram Oxalate; Take 1 tablet (10 mg total) by mouth daily.  Dispense: 30 tablet; Refill: 2  Medication side effect -     Olmesartan Medoxomil; Take 1 tablet (20 mg total) by mouth daily.  Dispense: 90 tablet; Refill: 1  Essential hypertension -     Olmesartan Medoxomil; Take 1 tablet (20 mg total) by mouth daily.  Dispense: 90 tablet; Refill: 1 -     CBC -     Comprehensive metabolic panel  Mixed hyperlipidemia -     Comprehensive metabolic panel -     Lipid panel  Screening for diabetes mellitus (DM) -     Hemoglobin A1c    Return in about 8 weeks (around 12/24/2022).  Start Lexapro.  Advised him to adjust the time of day taking that that seems him.  Encouraged regular exercise for 30 minutes 5 days weekly.  Walking would be fine.  Discontinued lisinopril and will start olmesartan for blood pressure.  Libby Maw, MD

## 2022-10-30 LAB — COMPREHENSIVE METABOLIC PANEL
ALT: 22 IU/L (ref 0–44)
AST: 15 IU/L (ref 0–40)
Albumin/Globulin Ratio: 2 (ref 1.2–2.2)
Albumin: 4.7 g/dL (ref 4.1–5.1)
Alkaline Phosphatase: 63 IU/L (ref 44–121)
BUN/Creatinine Ratio: 10 (ref 9–20)
BUN: 9 mg/dL (ref 6–24)
Bilirubin Total: 0.4 mg/dL (ref 0.0–1.2)
CO2: 25 mmol/L (ref 20–29)
Calcium: 9.5 mg/dL (ref 8.7–10.2)
Chloride: 100 mmol/L (ref 96–106)
Creatinine, Ser: 0.94 mg/dL (ref 0.76–1.27)
Globulin, Total: 2.3 g/dL (ref 1.5–4.5)
Glucose: 90 mg/dL (ref 70–99)
Potassium: 4.8 mmol/L (ref 3.5–5.2)
Sodium: 140 mmol/L (ref 134–144)
Total Protein: 7 g/dL (ref 6.0–8.5)
eGFR: 103 mL/min/{1.73_m2} (ref >59)

## 2022-10-30 LAB — LIPID PANEL
Chol/HDL Ratio: 5 ratio (ref 0.0–5.0)
Cholesterol, Total: 236 mg/dL — ABNORMAL HIGH (ref 100–199)
HDL: 47 mg/dL (ref >39)
LDL Chol Calc (NIH): 159 mg/dL — ABNORMAL HIGH (ref 0–99)
Triglycerides: 167 mg/dL — ABNORMAL HIGH (ref 0–149)
VLDL Cholesterol Cal: 30 mg/dL (ref 5–40)

## 2022-10-30 LAB — HEMOGLOBIN A1C
Est. average glucose Bld gHb Est-mCnc: 114 mg/dL
Hgb A1c MFr Bld: 5.6 % (ref 4.8–5.6)

## 2022-10-30 LAB — CBC
Hematocrit: 47.8 % (ref 37.5–51.0)
Hemoglobin: 16 g/dL (ref 13.0–17.7)
MCH: 30 pg (ref 26.6–33.0)
MCHC: 33.5 g/dL (ref 31.5–35.7)
MCV: 90 fL (ref 79–97)
Platelets: 256 10*3/uL (ref 150–450)
RBC: 5.34 x10E6/uL (ref 4.14–5.80)
RDW: 12.9 % (ref 11.6–15.4)
WBC: 7.1 10*3/uL (ref 3.4–10.8)

## 2022-11-01 ENCOUNTER — Encounter: Payer: Self-pay | Admitting: Family Medicine

## 2022-11-23 ENCOUNTER — Encounter: Payer: Self-pay | Admitting: Internal Medicine

## 2022-11-23 ENCOUNTER — Ambulatory Visit: Payer: Managed Care, Other (non HMO) | Admitting: Internal Medicine

## 2022-11-23 VITALS — BP 120/84 | HR 60 | Temp 97.9°F | Resp 20 | Ht 69.0 in | Wt 192.7 lb

## 2022-11-23 DIAGNOSIS — J302 Other seasonal allergic rhinitis: Secondary | ICD-10-CM

## 2022-11-23 DIAGNOSIS — Z720 Tobacco use: Secondary | ICD-10-CM

## 2022-11-23 DIAGNOSIS — J330 Polyp of nasal cavity: Secondary | ICD-10-CM

## 2022-11-23 DIAGNOSIS — J328 Other chronic sinusitis: Secondary | ICD-10-CM

## 2022-11-23 DIAGNOSIS — K219 Gastro-esophageal reflux disease without esophagitis: Secondary | ICD-10-CM

## 2022-11-23 DIAGNOSIS — J3089 Other allergic rhinitis: Secondary | ICD-10-CM

## 2022-11-23 DIAGNOSIS — Z87891 Personal history of nicotine dependence: Secondary | ICD-10-CM

## 2022-11-23 MED ORDER — MONTELUKAST SODIUM 10 MG PO TABS: 10.0000 mg | ORAL_TABLET | Freq: Every day | ORAL | 1 refills | Status: AC

## 2022-11-23 NOTE — Patient Instructions (Signed)
Chronic Rhinitis Seasonal and Perennial Allergic: with nasal polyps not well controlled  - allergy testing today: Positive to grass, weeds, tree, mold, cat, roach  - Prevention:  - allergen avoidance when possible -Start allergy injections via [facility] protocol  - Symptom control: - Continue Nasal Steroid Spray: Best results if used daily. - Options include Flonase (fluticasone), Nasocort (triamcinolone), Nasonex (mometasome) 1- 2 sprays in each nostril daily.  - All can be purchased over-the-counter if not covered by insurance. - Start Singulair (Montelukast) 10mg  nightly.   - Discontinue if nightmares of behavior changes. - Continue Antihistamine: daily or daily as needed.   -Options include Zyrtec (Cetirizine) 10mg , Claritin (Loratadine) 10mg , Allegra (Fexofenadine) 180mg , or Xyzal (Levocetirinze) 5mg  - Can be purchased over-the-counter if not covered by insurance.  Start allergy injections. Had a detailed discussion with patient/family that clinical history is suggestive of allergic rhinitis, and may benefit from allergy immunotherapy (AIT). Discussed in detail regarding the dosing, schedule, side effects (mild to moderate local allergic reaction and rarely systemic allergic reactions including anaphylaxis/death), alternatives and benefits (significant improvement in nasal symptoms, seasonal flares of asthma) of immunotherapy with the patient. There is significant time commitment involved with allergy shots, which includes weekly immunotherapy injections for first 9-12 months and then biweekly to monthly injections for 3-5 years. Clinical response is often delayed and patient may not see an improvement for 6-12 months. Consent was signed. I have prescribed epinephrine injectable and demonstrated proper use. For mild symptoms you can take over the counter antihistamines such as Benadryl and monitor symptoms closely. If symptoms worsen or if you have severe symptoms including breathing issues,  throat closure, significant swelling, whole body hives, severe diarrhea and vomiting, lightheadedness then inject epinephrine and seek immediate medical care afterwards. Action plan given.   Follow up: for [facility] desensitizations   Thank you so much for letting me partake in your care today.  Don't hesitate to reach out if you have any additional concerns!  Roney Marion, MD  [facility] [place], [place]  Reducing Pollen Exposure  The American Academy of Allergy, Asthma and Immunology suggests the following steps to reduce your exposure to pollen during allergy seasons.    Do not hang sheets or clothing out to dry; pollen may collect on these items. Do not mow lawns or spend time around freshly cut grass; mowing stirs up pollen. Keep windows closed at night.  Keep car windows closed while driving. Minimize morning activities outdoors, a time when pollen counts are usually at their highest. Stay indoors as much as possible when pollen counts or humidity is high and on windy days when pollen tends to remain in the air longer. Use air conditioning when possible.  Many air conditioners have filters that trap the pollen spores. Use a HEPA room air filter to remove pollen form the indoor air you breathe.  Control of Mold Allergen   Mold and fungi can grow on a variety of surfaces provided certain temperature and moisture conditions exist.  Outdoor molds grow on plants, decaying vegetation and soil.  The major outdoor mold, Alternaria and Cladosporium, are found in very high numbers during hot and dry conditions.  Generally, a late Summer - Fall peak is seen for common outdoor fungal spores.  Rain will temporarily lower outdoor mold spore count, but counts rise rapidly when the rainy period ends.  The most important indoor molds are Aspergillus and Penicillium.  Dark, humid and poorly ventilated basements are ideal sites for mold growth.  The next most common sites of mold growth are  the bathroom and the kitchen.   Indoor (Perennial) Mold Control   Positive indoor molds via skin testing: Aspergillus, Penicillium, Fusarium, Aureobasidium (Pullulara), Rhizopus, and Trichophyton  Maintain humidity below 50%. Clean washable surfaces with 5% bleach solution. Remove sources e.g. contaminated carpets.  Control of Dog or Cat Allergen  Avoidance is the best way to manage a dog or cat allergy. If you have a dog or cat and are allergic to dog or cats, consider removing the dog or cat from the home. If you have a dog or cat but don't want to find it a new home, or if your family wants a pet even though someone in the household is allergic, here are some strategies that may help keep symptoms at bay:  Keep the pet out of your bedroom and restrict it to only a few rooms. Be advised that keeping the dog or cat in only one room will not limit the allergens to that room. Don't pet, hug or kiss the dog or cat; if you do, wash your hands with soap and water. High-efficiency particulate air (HEPA) cleaners run continuously in a bedroom or living room can reduce allergen levels over time. Regular use of a high-efficiency vacuum cleaner or a central vacuum can reduce allergen levels. Giving your dog or cat a bath at least once a week can reduce airborne allergen.  DUST MITE AVOIDANCE MEASURES:  There are three main measures that need and can be taken to avoid house dust mites:  Reduce accumulation of dust in general -reduce furniture, clothing, carpeting, books, stuffed animals, especially in bedroom  Separate yourself from the dust -use pillow and mattress encasements (can be found at stores such as [employer] or online) -avoid direct exposure to air condition flow -use a HEPA filter device, especially in the bedroom; you can also use a HEPA filter vacuum cleaner -wipe dust with a moist towel instead of a dry towel or broom when cleaning  Decrease mites and/or their  secretions -wash clothing and linen and stuffed animals at highest temperature possible, at least every 2 weeks -stuffed animals can also be placed in a bag and put in a freezer overnight  Despite the above measures, it is impossible to eliminate dust mites or their allergen completely from your home.  With the above measures the burden of mites in your home can be diminished, with the goal of minimizing your allergic symptoms.  Success will be reached only when implementing and using all means together.

## 2022-11-23 NOTE — Progress Notes (Signed)
New Patient Note  RE: Cory Hoffman MRN: [id number] DOB: May 26, 1976 Date of Office Visit: 11/23/2022  Consult requested by: Libby Maw,* Primary care provider: Libby Maw, MD  Chief Complaint: Allergic Rhinitis  (Had surgery to remove his nasal polyps last August or september) and Nasal Polyps (Removal last August or September. Recently in the last few months starting to have nose bleed in the mid morning hours)  History of Present Illness: I had the pleasure of seeing Cory Hoffman for initial evaluation at the [facility] on 11/23/2022. He is a 47 y.o. male, who is referred here by Libby Maw, MD for the evaluation of nasal poylps and allergic rhinitis .  History obtained from patient, chart review.  Chronic rhinitis: started 2 years ago developed URI, could not recover and ultimately diagnosed with CRSwNP Symptoms include:  springtime sinus infections over the past 2-5 years and nasal congestion, rhinnorhea  Occurs  intermittent  Potential triggers: unknown  Treatments tried: claritin/xyzal , flonase,  Previous allergy testing: yes sIgE: pansensitization, records unavailable History of reflux/heartburn: yes: omeprazole night, fairly good control if no dietary discretions  History of chronic sinusitis or sinus surgery:  FESS/ Polypectomy in AUG 2023  Nonallergic triggers:  denies     Assessment and Plan: Cory Hoffman is a 47 y.o. male with: Seasonal and perennial allergic rhinitis - Plan: Allergy Test, Interdermal Allergy Test, Allergen Immunotherapy, Allergen Immunotherapy  Polyp of nasal cavity  Other chronic sinusitis  Gastroesophageal reflux disease without esophagitis  Stopped smoking with greater than 20 pack year history  Vapes nicotine containing substance   Plan: Patient Instructions  Chronic Rhinitis Seasonal and Perennial Allergic: with nasal polyps not well controlled  - allergy testing today: Positive to  grass, weeds, tree, mold, cat, roach  - Prevention:  - allergen avoidance when possible -Start allergy injections via Rush protocol  - Symptom control: - Continue Nasal Steroid Spray: Best results if used daily. - Options include Flonase (fluticasone), Nasocort (triamcinolone), Nasonex (mometasome) 1- 2 sprays in each nostril daily.  - All can be purchased over-the-counter if not covered by insurance. - Start Singulair (Montelukast) 10mg  nightly.   - Discontinue if nightmares of behavior changes. - Continue Antihistamine: daily or daily as needed.   -Options include Zyrtec (Cetirizine) 10mg , Claritin (Loratadine) 10mg , Allegra (Fexofenadine) 180mg , or Xyzal (Levocetirinze) 5mg  - Can be purchased over-the-counter if not covered by insurance.  Start allergy injections. Had a detailed discussion with patient/family that clinical history is suggestive of allergic rhinitis, and may benefit from allergy immunotherapy (AIT). Discussed in detail regarding the dosing, schedule, side effects (mild to moderate local allergic reaction and rarely systemic allergic reactions including anaphylaxis/death), alternatives and benefits (significant improvement in nasal symptoms, seasonal flares of asthma) of immunotherapy with the patient. There is significant time commitment involved with allergy shots, which includes weekly immunotherapy injections for first 9-12 months and then biweekly to monthly injections for 3-5 years. Clinical response is often delayed and patient may not see an improvement for 6-12 months. Consent was signed. I have prescribed epinephrine injectable and demonstrated proper use. For mild symptoms you can take over the counter antihistamines such as Benadryl and monitor symptoms closely. If symptoms worsen or if you have severe symptoms including breathing issues, throat closure, significant swelling, whole body hives, severe diarrhea and vomiting, lightheadedness then inject epinephrine and  seek immediate medical care afterwards. Action plan given.   Follow up: for RUSH desensitizations   Thank you  so much for letting me partake in your care today.  Don't hesitate to reach out if you have any additional concerns!  Roney Marion, MD  [facility] [place] [place]  Reducing Pollen Exposure  The American Academy of Allergy, Asthma and Immunology suggests the following steps to reduce your exposure to pollen during allergy seasons.    Do not hang sheets or clothing out to dry; pollen may collect on these items. Do not mow lawns or spend time around freshly cut grass; mowing stirs up pollen. Keep windows closed at night.  Keep car windows closed while driving. Minimize morning activities outdoors, a time when pollen counts are usually at their highest. Stay indoors as much as possible when pollen counts or humidity is high and on windy days when pollen tends to remain in the air longer. Use air conditioning when possible.  Many air conditioners have filters that trap the pollen spores. Use a HEPA room air filter to remove pollen form the indoor air you breathe.  Control of Mold Allergen   Mold and fungi can grow on a variety of surfaces provided certain temperature and moisture conditions exist.  Outdoor molds grow on plants, decaying vegetation and soil.  The major outdoor mold, Alternaria and Cladosporium, are found in very high numbers during hot and dry conditions.  Generally, a late Summer - Fall peak is seen for common outdoor fungal spores.  Rain will temporarily lower outdoor mold spore count, but counts rise rapidly when the rainy period ends.  The most important indoor molds are Aspergillus and Penicillium.  Dark, humid and poorly ventilated basements are ideal sites for mold growth.  The next most common sites of mold growth are the bathroom and the kitchen.   Indoor (Perennial) Mold Control   Positive indoor molds via skin testing: Aspergillus,  Penicillium, Fusarium, Aureobasidium (Pullulara), Rhizopus, and Trichophyton  Maintain humidity below 50%. Clean washable surfaces with 5% bleach solution. Remove sources e.g. contaminated carpets.  Control of Dog or Cat Allergen  Avoidance is the best way to manage a dog or cat allergy. If you have a dog or cat and are allergic to dog or cats, consider removing the dog or cat from the home. If you have a dog or cat but don't want to find it a new home, or if your family wants a pet even though someone in the household is allergic, here are some strategies that may help keep symptoms at bay:  Keep the pet out of your bedroom and restrict it to only a few rooms. Be advised that keeping the dog or cat in only one room will not limit the allergens to that room. Don't pet, hug or kiss the dog or cat; if you do, wash your hands with soap and water. High-efficiency particulate air (HEPA) cleaners run continuously in a bedroom or living room can reduce allergen levels over time. Regular use of a high-efficiency vacuum cleaner or a central vacuum can reduce allergen levels. Giving your dog or cat a bath at least once a week can reduce airborne allergen.  DUST MITE AVOIDANCE MEASURES:  There are three main measures that need and can be taken to avoid house dust mites:  Reduce accumulation of dust in general -reduce furniture, clothing, carpeting, books, stuffed animals, especially in bedroom  Separate yourself from the dust -use pillow and mattress encasements (can be found at stores such as [employer] or online) -avoid direct exposure to air condition flow -  use a HEPA filter device, especially in the bedroom; you can also use a HEPA filter vacuum cleaner -wipe dust with a moist towel instead of a dry towel or broom when cleaning  Decrease mites and/or their secretions -wash clothing and linen and stuffed animals at highest temperature possible, at least every 2 weeks -stuffed  animals can also be placed in a bag and put in a freezer overnight  Despite the above measures, it is impossible to eliminate dust mites or their allergen completely from your home.  With the above measures the burden of mites in your home can be diminished, with the goal of minimizing your allergic symptoms.  Success will be reached only when implementing and using all means together.     Meds ordered this encounter  Medications   montelukast (SINGULAIR) 10 MG tablet    Sig: Take 1 tablet (10 mg total) by mouth at bedtime.    Dispense:  90 tablet    Refill:  1   Lab Orders  No laboratory test(s) ordered today    Other allergy screening: Asthma: no Rhino conjunctivitis: yes Food allergy: no Medication allergy: no Hymenoptera allergy: no Urticaria: no Eczema:no History of recurrent infections suggestive of immunodeficency: no  Diagnostics:  Skin Testing: Environmental allergy panel and select foods.  Epicutaneous Testing: negative   Intradermal Testing: Grass, weed, tree, mold, cat, roach, dust mite  adequate controls  Results interpreted by myself and discussed with patient/family.  Airborne Adult Perc - 11/23/22 1517     Time Antigen Placed 0300    Allergen Manufacturer Lavella Hammock    Location Back    Number of Test 59    1. Control-Buffer 50% Glycerol Negative    2. Control-Histamine 1 mg/ml 4+    3. Albumin saline Negative    4. Paullina Negative    5. [place] Negative    6. Johnson Negative    7. Gro[place] Blue Negative    8. Meadow Fescue Negative    9. Perennial Rye Negative    10. Sweet Vernal Negative    11. Timothy Negative    12. Cocklebur Negative    13. Burweed Marshelder Negative    14. Ragweed, short Negative    15. Ragweed, Giant Negative    16. Plantain,  English Negative    17. Lamb's Quarters Negative    18. Sheep Sorrell Negative    19. Rough Pigweed Negative    20. Marsh Elder, Rough Negative    21. Mugwort, Common Negative    22. Ash mix  Negative    23. Birch mix Negative    24. Beech American Negative    25. Box, Elder Negative    26. Cedar, red Negative    27. Cottonwood, [place] Negative    28. Elm mix Negative    29. Hickory Negative    30. Maple mix Negative    31. Oak, [place] mix Negative    32. Pecan Pollen Negative    33. Pine mix Negative    34. Sycamore Eastern Negative    35. Kechi, Black Pollen Negative    36. Alternaria alternata Negative    37. Cladosporium Herbarum Negative    38. Aspergillus mix Negative    39. Penicillium mix Negative    40. Bipolaris sorokiniana (Helminthosporium) Negative    41. Drechslera spicifera (Curvularia) Negative    42. Mucor plumbeus Negative    43. Fusarium moniliforme Negative    44. Aureobasidium pullulans (pullulara) Negative    45. Rhizopus  oryzae Negative    46. Botrytis cinera Negative    47. Epicoccum nigrum Negative    48. Phoma betae Negative    49. Candida Albicans Negative    50. Trichophyton mentagrophytes 3+    51. Mite, D Farinae  5,000 AU/ml Negative    52. Mite, D Pteronyssinus  5,000 AU/ml Negative    53. Cat Hair 10,000 BAU/ml Negative    54.  Dog Epithelia Negative    55. Mixed Feathers Negative    56. Horse Epithelia Negative    57. Cockroach, German Negative    58. Mouse Negative    59. Tobacco Leaf Negative             Food Perc - 11/23/22 1517       Test Information   Time Antigen Placed 0300    Allergen Manufacturer Lavella Hammock    Location Back    Number of allergen test 10      Food   1. Peanut Negative    2. Soybean food Negative    3. Wheat, whole Negative    4. Sesame Negative    5. Milk, cow Negative    6. Egg White, chicken Negative    7. Casein Negative    8. Shellfish mix Negative    9. Fish mix Negative    10. Cashew Negative             Intradermal - 11/23/22 1532     Time Antigen Placed 1532    Allergen Manufacturer Greer    Location Arm    Number of Test 15    Control Negative    [place] 3+     Johnson 3+    7 Grass 3+    Ragweed mix 3+    Weed mix 3+    Tree mix 3+    Mold 1 Negative    Mold 2 2+    Mold 3 Negative    Mold 4 2+    Cat 2+    Dog Negative    Cockroach 2+    Mite mix 2+             Past Medical History: Patient Active Problem List   Diagnosis Date Noted   Seasonal and perennial allergic rhinitis 11/23/2022   Other chronic sinusitis 11/23/2022   Stress at work 10/29/2022   Polyp of nasal cavity 03/29/2022   Acute non-recurrent sinusitis 10/12/2021   Strain of lumbar region 07/23/2021   Acute bilateral low back pain without sciatica 07/23/2021   History of UTI 04/17/2021   Medication side effect 04/17/2021   Mixed hyperlipidemia 04/07/2021   Acute gastritis without hemorrhage 07/04/2019   Influenza A 10/23/2018   Dysfunction of both eustachian tubes 06/20/2018   Arthritis of knee 03/10/2018   Epigastric pain 03/10/2018   Chronic pain of left knee 02/22/2018   Nicotine dependence, cigarettes, uncomplicated [id number]   Insomnia 08/26/2017   Anxiety 08/26/2017   Hypercholesteremia 08/26/2017   Migraine without status migrainosus, not intractable 08/26/2017   Essential hypertension 03/14/2017   Healthcare maintenance 04/07/2016   OSA (obstructive sleep apnea) 09/24/2015   Vitamin D deficiency 05/26/2015   Gastroesophageal reflux disease without esophagitis 05/11/2015   Obesity (BMI 30-39.9) 05/11/2015   Family history of premature CAD 04/07/2015   Past Medical History:  Diagnosis Date   Chronic headaches    Frequent headaches    HTN (hypertension)    Hyperlipidemia    Migraines    Past Surgical History: Past Surgical  History:  Procedure Laterality Date   BACK SURGERY     KNEE ARTHROSCOPY     WISDOM TOOTH EXTRACTION     Medication List:  Current Outpatient Medications  Medication Sig Dispense Refill   escitalopram (LEXAPRO) 10 MG tablet Take 1 tablet (10 mg total) by mouth daily. 30 tablet 2   fluticasone (FLONASE) 50 MCG/ACT  nasal spray Administer 1 spray in each nostril 2 times daily.     levocetirizine (XYZAL) 5 MG tablet Take 1 tablet by mouth daily.     loratadine (CLARITIN) 10 MG tablet Take 1 tablet by mouth daily.     montelukast (SINGULAIR) 10 MG tablet Take 1 tablet (10 mg total) by mouth at bedtime. 90 tablet 1   omeprazole (PRILOSEC) 20 MG capsule TAKE 1 CAPSULE(20 MG) BY MOUTH DAILY 90 capsule 3   pravastatin (PRAVACHOL) 20 MG tablet TAKE 1 TABLET(20 MG) BY MOUTH DAILY AT NIGHT 90 tablet 1   zolpidem (AMBIEN) 10 MG tablet TAKE 1 TABLET(10 MG) BY MOUTH AT BEDTIME AS NEEDED FOR SLEEP 90 tablet 0   No current facility-administered medications for this visit.   Allergies: No Known Allergies Social History: Social History   Socioeconomic History   Marital status: Married    Spouse name: Not on file   Number of children: 4   Years of education: 16   Highest education level: Not on file  Occupational History   Occupation: Air cabin crew  Tobacco Use   Smoking status: Former    Packs/day: 1.00    Years: 15.00    Additional pack years: 0.00    Total pack years: 15.00    Types: Cigarettes    Quit date: 01/26/2022    Years since quitting: 0.8   Smokeless tobacco: Never  Vaping Use   Vaping Use: Never used  Substance and Sexual Activity   Alcohol use: Yes    Alcohol/week: 0.0 standard drinks of alcohol    Comment: up to 3-4 drinks in a given setting sporadically.   Drug use: No   Sexual activity: Yes  Other Topics Concern   Not on file  Social History Narrative   Fun/Hobby: Racing, drag racing.    Social Determinants of Health   Financial Resource Strain: Not on file  Food Insecurity: Not on file  Transportation Needs: Not on file  Physical Activity: Not on file  Stress: Not on file  Social Connections: Not on file   Lives in a single-family home that is 45 years old.  There are no roaches in the house and bed is 2 feet off the floor.  Not exposed to [place] industrial [place].  He is  exposed to fumes, chemicals and dust through his job as an Sales promotion account executive at [employer].  No dust mite precautions.  Vape exposure inside the home and car. Smoking: Started vaping August 2023, quit smoking cigarettes August 2023.  Prior 25-pack-year history.  Currently vapes daily  Environmental History: Water Damage/mildew in the house: no Carpet in the family room: yes Carpet in the bedroom: no Heating: gas Cooling: central Pet: yes 1 dog with access to bedroom  Family History: Family History  Problem Relation Age of Onset   Heart disease Mother    Cancer - Lung Mother    Heart disease Father    Stroke Paternal Grandmother    Heart attack Paternal Grandfather        Mid 64s     ROS: All others negative except as noted per HPI.  Objective: BP 120/84 (BP Location: Right Arm, Patient Position: Sitting, Cuff Size: Normal)   Pulse 60   Temp 97.9 F (36.6 C) (Temporal)   Resp 20   Ht 5\' 9"  (1.753 m)   Wt 192 lb 11.2 oz (87.4 kg)   SpO2 99%   BMI 28.46 kg/m  Body mass index is 28.46 kg/m.  General Appearance:  Alert, cooperative, no distress, appears stated age  Head:  Normocephalic, without obvious abnormality, atraumatic  Eyes:  Conjunctiva clear, EOM's intact  Nose: Nares normal,  erythematous nasal mucosa, with clear rhinorrhea, hypertrophic turbinates, no visible anterior polyps, and septum midline  Throat: Lips, tongue normal; teeth and gums normal, + cobblestoning  Neck: Supple, symmetrical  Lungs:   clear to auscultation bilaterally, Respirations unlabored, no coughing  Heart:  regular rate and rhythm and no murmur, Appears well perfused  Extremities: No edema  Skin: Skin color, texture, turgor normal, no rashes or lesions on visualized portions of skin  Neurologic: No gross deficits   The plan was reviewed with the patient/family, and all questions/concerned were addressed.  It was my pleasure to see Menzo today and participate in his care. Please feel  free to contact me with any questions or concerns.  Sincerely,  Roney Marion, MD Allergy & Immunology  [facility] office: [phone] [facility] office: [phone]

## 2022-11-24 NOTE — Progress Notes (Signed)
Aeroallergen Immunotherapy  Ordering Provider: Dr. Roney Marion  Patient Details Name: BARNET ANDERER MRN: [id number] Date of Birth: 05-29-1976  Order 2 of 2  Vial Label: M-R-DM  0.2 ml (Volume)  1:10 Concentration -- Aspergillus mix 0.2 ml (Volume)  1:10 Concentration -- Penicillium mix 0.2 ml (Volume)  1:10 Concentration -- Fusarium moniliforme 0.2 ml (Volume)  1:40 Concentration -- Aureobasidium pullulans 0.2 ml (Volume)  1:10 Concentration -- Rhizopus oryzae 0.3 ml (Volume)  1:20 Concentration -- Cockroach, German 0.5 ml (Volume)   AU Concentration -- Mite Mix (DF 5,000 & DP 5,000)   1.8  ml Extract Subtotal 3.2  ml Diluent 5.0  ml Maintenance Total  Schedule:  RUSH Silver Vial (1:1,000,000): RUSH Blue Vial (1:100,000): RUSH Yellow Vial (1:10,000): RUSH Green Vial (1:1,000): Schedule B (6 doses) Red Vial (1:100): Schedule A (10 doses)  Special Instructions: RUSH

## 2022-11-24 NOTE — Progress Notes (Signed)
NOT MADE UNTIL APPT SCHED.

## 2022-11-24 NOTE — Progress Notes (Signed)
Aeroallergen Immunotherapy   Ordering Provider: Dr. Roney Marion   Patient Details  Name: Cory Hoffman  MRN: [id number]  Date of Birth: 06/28/1976   Order 1 of 2   Vial Label: G-W-T-C   0.3 ml (Volume)  BAU Concentration -- 7 Grass Mix* 100,000 ([address] [place], [place], [place], [place] Rye, RedTop, Sweet Vernal, Timothy)  0.3 ml (Volume)  BAU Concentration -- Guatemala 10,000  0.2 ml (Volume)  1:20 Concentration -- Johnson  0.3 ml (Volume)  1:20 Concentration -- Ragweed Mix  0.5 ml (Volume)  1:20 Concentration -- Weed Mix*  0.5 ml (Volume)  1:20 Concentration -- Eastern 10 Tree Mix (also Sweet Gum)  0.5 ml (Volume)  1:10 Concentration -- Cat Hair    2.6  ml Extract Subtotal  2.4  ml Diluent  5.0  ml Maintenance Total   Schedule:  RUSH  Silver Vial (1:1,000,000): RUSH  Blue Vial (1:100,000): RUSH  Yellow Vial (1:10,000): RUSH  Green Vial (1:1,000): Schedule B (6 doses)  Red Vial (1:100): Schedule A (10 doses)   Special Instructions: RUSH

## 2022-11-30 NOTE — Progress Notes (Signed)
VIALS EXP 11-30-23 

## 2022-12-01 DIAGNOSIS — J3081 Allergic rhinitis due to animal (cat) (dog) hair and dander: Secondary | ICD-10-CM | POA: Diagnosis not present

## 2022-12-02 DIAGNOSIS — J3089 Other allergic rhinitis: Secondary | ICD-10-CM | POA: Diagnosis not present

## 2022-12-22 ENCOUNTER — Other Ambulatory Visit: Payer: Self-pay | Admitting: Family Medicine

## 2022-12-22 DIAGNOSIS — Z566 Other physical and mental strain related to work: Secondary | ICD-10-CM

## 2022-12-22 DIAGNOSIS — G4700 Insomnia, unspecified: Secondary | ICD-10-CM

## 2022-12-29 ENCOUNTER — Other Ambulatory Visit: Payer: Self-pay

## 2022-12-29 MED ORDER — PREDNISONE 20 MG PO TABS: ORAL_TABLET | ORAL | 0 refills | Status: DC

## 2022-12-29 MED ORDER — EPINEPHRINE 0.3 MG/0.3ML IJ SOAJ: 0.3000 mg | INTRAMUSCULAR | 0 refills | Status: AC | PRN

## 2022-12-29 MED ORDER — FAMOTIDINE 20 MG PO TABS: ORAL_TABLET | ORAL | 0 refills | Status: AC

## 2022-12-29 NOTE — Telephone Encounter (Signed)
Pre-meds sent to pharmacy pt aware.

## 2022-12-30 NOTE — Progress Notes (Signed)
RAPID DESENSITIZATION Note  RE: Cory Hoffman MRN: [id number] DOB: [birth year] Date of Office Visit: 12/31/2022  Subjective:  Patient presents today for rapid desensitization.  Interval History: Patient has not been ill, he has taken all premedications as per protocol.  Recent/Current History: Pulmonary disease: no Cardiac disease: no Respiratory infection: no Rash: no Itch: no Swelling: no Cough: no Shortness of breath: no Runny/stuffy nose: no Itchy eyes: no Beta-blocker use: no  Patient/guardian was informed of the procedure with verbalized understanding of the risk of anaphylaxis. Consent has been signed.   Medication List:  Current Outpatient Medications  Medication Sig Dispense Refill   EPINEPHrine 0.3 mg/0.3 mL IJ SOAJ injection Inject 0.3 mg into the muscle as needed for anaphylaxis. 1 each 0   escitalopram (LEXAPRO) 10 MG tablet TAKE 1 TABLET(10 MG) BY MOUTH DAILY 90 tablet 1   famotidine (PEPCID) 20 MG tablet Take 1 tab twice daily day before and day of RUSH appt. 4 tablet 0   fluticasone (FLONASE) 50 MCG/ACT nasal spray Administer 1 spray in each nostril 2 times daily.     levocetirizine (XYZAL) 5 MG tablet Take 1 tablet by mouth daily.     loratadine (CLARITIN) 10 MG tablet Take 1 tablet by mouth daily.     montelukast (SINGULAIR) 10 MG tablet Take 1 tablet (10 mg total) by mouth at bedtime. 90 tablet 1   omeprazole (PRILOSEC) 20 MG capsule TAKE 1 CAPSULE(20 MG) BY MOUTH DAILY 90 capsule 3   pravastatin (PRAVACHOL) 20 MG tablet TAKE 1 TABLET(20 MG) BY MOUTH DAILY AT NIGHT 90 tablet 1   predniSONE (DELTASONE) 20 MG tablet Take 2 tabs morning day before & morning of RUSH appt. 4 tablet 0   zolpidem (AMBIEN) 10 MG tablet TAKE 1 TABLET(10 MG) BY MOUTH AT BEDTIME AS NEEDED FOR SLEEP 90 tablet 1   No current facility-administered medications for this visit.   Allergies: No Known Allergies I reviewed his past medical history, social history, family history, and  environmental history and no significant changes have been reported from his previous visit.  ROS: Negative except as per HPI.  Objective: There were no vitals taken for this visit. There is no height or weight on file to calculate BMI.   General Appearance:  Alert, cooperative, no distress, appears stated age  Head:  Normocephalic, without obvious abnormality, atraumatic  Eyes:  Conjunctiva clear, EOM's intact  Nose: Nares normal  Throat: Lips, tongue normal; teeth and gums normal, normal posterior oropharnyx  Neck: Supple, symmetrical  Lungs:   CTAB, Respirations unlabored, no coughing  Heart:  RRR, no murmur, Appears well perfused  Extremities: No edema  Skin: Skin color, texture, turgor normal, no rashes or lesions on visualized portions of skin  Neurologic: No gross deficits   Diagnostics:  PROCEDURES:  Patient received the following doses every hour: Step 1:  0.85ml - 1:1,000,000 dilution (silver vial) Step 2:  0.34ml - 1:1,000,000 dilution (silver vial) Step 3: 0.16ml - 1:100,000 dilution (blue vial)  Step 4: 0.75ml - 1:100,000 dilution (blue vial)  Step 5: 0.48ml - 1:10,000 dilution (gold vial) Step 6: 0.23ml - 1:10,000 dilution (gold vial) Step 7: 0.82ml - 1:10,000 dilution (gold vial) Step 8: 0.88ml - 1:10,000 dilution (gold vial)  Patient was observed for 1 hour after the last dose.   Procedure started at 08:30 AM Procedure ended at 2:46 PM   ASSESSMENT/PLAN:   Patient has tolerated the rapid desensitization protocol.  Next appointment: Start at 0.4ml of 1:1000 dilution (  green vial) and build up per protocol.

## 2022-12-31 ENCOUNTER — Encounter: Payer: Self-pay | Admitting: Internal Medicine

## 2022-12-31 ENCOUNTER — Ambulatory Visit: Payer: Managed Care, Other (non HMO) | Admitting: Internal Medicine

## 2022-12-31 VITALS — BP 126/80 | HR 60 | Temp 98.3°F | Resp 18

## 2022-12-31 DIAGNOSIS — J309 Allergic rhinitis, unspecified: Secondary | ICD-10-CM

## 2022-12-31 DIAGNOSIS — J3089 Other allergic rhinitis: Secondary | ICD-10-CM

## 2022-12-31 DIAGNOSIS — J302 Other seasonal allergic rhinitis: Secondary | ICD-10-CM | POA: Diagnosis not present

## 2023-01-10 ENCOUNTER — Ambulatory Visit (INDEPENDENT_AMBULATORY_CARE_PROVIDER_SITE_OTHER): Payer: Managed Care, Other (non HMO)

## 2023-01-10 DIAGNOSIS — J309 Allergic rhinitis, unspecified: Secondary | ICD-10-CM

## 2023-01-11 ENCOUNTER — Other Ambulatory Visit: Payer: Self-pay | Admitting: Family Medicine

## 2023-01-11 DIAGNOSIS — E782 Mixed hyperlipidemia: Secondary | ICD-10-CM

## 2023-01-21 ENCOUNTER — Ambulatory Visit (INDEPENDENT_AMBULATORY_CARE_PROVIDER_SITE_OTHER): Payer: Managed Care, Other (non HMO)

## 2023-01-21 DIAGNOSIS — J309 Allergic rhinitis, unspecified: Secondary | ICD-10-CM

## 2023-01-31 ENCOUNTER — Ambulatory Visit (INDEPENDENT_AMBULATORY_CARE_PROVIDER_SITE_OTHER): Payer: Managed Care, Other (non HMO)

## 2023-01-31 DIAGNOSIS — J309 Allergic rhinitis, unspecified: Secondary | ICD-10-CM | POA: Diagnosis not present

## 2023-02-07 ENCOUNTER — Ambulatory Visit (INDEPENDENT_AMBULATORY_CARE_PROVIDER_SITE_OTHER): Payer: Managed Care, Other (non HMO)

## 2023-02-07 DIAGNOSIS — J309 Allergic rhinitis, unspecified: Secondary | ICD-10-CM

## 2023-02-15 ENCOUNTER — Ambulatory Visit (INDEPENDENT_AMBULATORY_CARE_PROVIDER_SITE_OTHER): Payer: Managed Care, Other (non HMO)

## 2023-02-15 DIAGNOSIS — J309 Allergic rhinitis, unspecified: Secondary | ICD-10-CM

## 2023-02-22 ENCOUNTER — Ambulatory Visit (INDEPENDENT_AMBULATORY_CARE_PROVIDER_SITE_OTHER): Payer: Managed Care, Other (non HMO)

## 2023-02-22 DIAGNOSIS — J309 Allergic rhinitis, unspecified: Secondary | ICD-10-CM

## 2023-03-02 ENCOUNTER — Ambulatory Visit: Payer: Self-pay

## 2023-03-02 DIAGNOSIS — J309 Allergic rhinitis, unspecified: Secondary | ICD-10-CM | POA: Diagnosis not present

## 2023-03-10 ENCOUNTER — Ambulatory Visit (INDEPENDENT_AMBULATORY_CARE_PROVIDER_SITE_OTHER): Payer: Managed Care, Other (non HMO)

## 2023-03-10 DIAGNOSIS — J309 Allergic rhinitis, unspecified: Secondary | ICD-10-CM | POA: Diagnosis not present

## 2023-03-16 ENCOUNTER — Ambulatory Visit (INDEPENDENT_AMBULATORY_CARE_PROVIDER_SITE_OTHER): Payer: Managed Care, Other (non HMO)

## 2023-03-16 DIAGNOSIS — J309 Allergic rhinitis, unspecified: Secondary | ICD-10-CM | POA: Diagnosis not present

## 2023-03-22 ENCOUNTER — Other Ambulatory Visit: Payer: Self-pay | Admitting: Family Medicine

## 2023-03-22 DIAGNOSIS — K219 Gastro-esophageal reflux disease without esophagitis: Secondary | ICD-10-CM

## 2023-03-25 ENCOUNTER — Ambulatory Visit (INDEPENDENT_AMBULATORY_CARE_PROVIDER_SITE_OTHER): Payer: Managed Care, Other (non HMO)

## 2023-03-25 DIAGNOSIS — J309 Allergic rhinitis, unspecified: Secondary | ICD-10-CM

## 2023-03-31 ENCOUNTER — Ambulatory Visit (INDEPENDENT_AMBULATORY_CARE_PROVIDER_SITE_OTHER): Payer: Managed Care, Other (non HMO)

## 2023-03-31 ENCOUNTER — Ambulatory Visit: Payer: Managed Care, Other (non HMO) | Admitting: Family Medicine

## 2023-03-31 ENCOUNTER — Encounter: Payer: Self-pay | Admitting: Family Medicine

## 2023-03-31 VITALS — BP 124/86 | HR 73 | Temp 97.7°F | Ht 69.0 in | Wt 192.6 lb

## 2023-03-31 DIAGNOSIS — Z0000 Encounter for general adult medical examination without abnormal findings: Secondary | ICD-10-CM | POA: Diagnosis not present

## 2023-03-31 DIAGNOSIS — G4700 Insomnia, unspecified: Secondary | ICD-10-CM | POA: Diagnosis not present

## 2023-03-31 DIAGNOSIS — Z566 Other physical and mental strain related to work: Secondary | ICD-10-CM

## 2023-03-31 DIAGNOSIS — J309 Allergic rhinitis, unspecified: Secondary | ICD-10-CM | POA: Diagnosis not present

## 2023-03-31 DIAGNOSIS — Z131 Encounter for screening for diabetes mellitus: Secondary | ICD-10-CM | POA: Diagnosis not present

## 2023-03-31 DIAGNOSIS — E782 Mixed hyperlipidemia: Secondary | ICD-10-CM

## 2023-03-31 DIAGNOSIS — I10 Essential (primary) hypertension: Secondary | ICD-10-CM

## 2023-03-31 DIAGNOSIS — Z8249 Family history of ischemic heart disease and other diseases of the circulatory system: Secondary | ICD-10-CM

## 2023-03-31 DIAGNOSIS — Z1211 Encounter for screening for malignant neoplasm of colon: Secondary | ICD-10-CM

## 2023-03-31 LAB — COMPREHENSIVE METABOLIC PANEL
ALT: 17 U/L (ref 0–53)
AST: 15 U/L (ref 0–37)
Albumin: 4.5 g/dL (ref 3.5–5.2)
Alkaline Phosphatase: 54 U/L (ref 39–117)
BUN: 12 mg/dL (ref 6–23)
CO2: 30 mEq/L (ref 19–32)
Calcium: 9.5 mg/dL (ref 8.4–10.5)
Chloride: 102 mEq/L (ref 96–112)
Creatinine, Ser: 0.97 mg/dL (ref 0.40–1.50)
GFR: 94.81 mL/min (ref >60.00)
Glucose, Bld: 93 mg/dL (ref 70–99)
Potassium: 4.5 mEq/L (ref 3.5–5.1)
Sodium: 139 mEq/L (ref 135–145)
Total Bilirubin: 0.4 mg/dL (ref 0.2–1.2)
Total Protein: 6.9 g/dL (ref 6.0–8.3)

## 2023-03-31 LAB — CBC WITH DIFFERENTIAL/PLATELET
Basophils Absolute: 0 10*3/uL (ref 0.0–0.1)
Basophils Relative: 0.6 % (ref 0.0–3.0)
Eosinophils Absolute: 0.3 10*3/uL (ref 0.0–0.7)
Eosinophils Relative: 4.1 % (ref 0.0–5.0)
HCT: 46.6 % (ref 39.0–52.0)
Hemoglobin: 15.5 g/dL (ref 13.0–17.0)
Lymphocytes Relative: 37.6 % (ref 12.0–46.0)
Lymphs Abs: 2.8 10*3/uL (ref 0.7–4.0)
MCHC: 33.3 g/dL (ref 30.0–36.0)
MCV: 91.3 fl (ref 78.0–100.0)
Monocytes Absolute: 0.4 10*3/uL (ref 0.1–1.0)
Monocytes Relative: 5.2 % (ref 3.0–12.0)
Neutro Abs: 3.9 10*3/uL (ref 1.4–7.7)
Neutrophils Relative %: 52.5 % (ref 43.0–77.0)
Platelets: 294 10*3/uL (ref 150.0–400.0)
RBC: 5.1 Mil/uL (ref 4.22–5.81)
RDW: 14 % (ref 11.5–15.5)
WBC: 7.5 10*3/uL (ref 4.0–10.5)

## 2023-03-31 LAB — LIPID PANEL
Cholesterol: 271 mg/dL — ABNORMAL HIGH (ref 0–200)
HDL: 53.8 mg/dL (ref >39.00)
LDL Cholesterol: 180 mg/dL — ABNORMAL HIGH (ref 0–99)
NonHDL: 217.31
Total CHOL/HDL Ratio: 5
Triglycerides: 185 mg/dL — ABNORMAL HIGH (ref 0.0–149.0)
VLDL: 37 mg/dL (ref 0.0–40.0)

## 2023-03-31 LAB — URINALYSIS, ROUTINE W REFLEX MICROSCOPIC
Bilirubin Urine: NEGATIVE
Hgb urine dipstick: NEGATIVE
Ketones, ur: NEGATIVE
Leukocytes,Ua: NEGATIVE
Nitrite: NEGATIVE
RBC / HPF: NONE SEEN (ref 0–?)
Specific Gravity, Urine: 1.02 (ref 1.000–1.030)
Total Protein, Urine: NEGATIVE
Urine Glucose: NEGATIVE
Urobilinogen, UA: 1 (ref 0.0–1.0)
pH: 7 (ref 5.0–8.0)

## 2023-03-31 LAB — HEMOGLOBIN A1C: Hgb A1c MFr Bld: 5.4 % (ref 4.6–6.5)

## 2023-03-31 NOTE — Progress Notes (Addendum)
Established Patient Office Visit   Subjective:  Patient ID: Cory Hoffman, male    DOB: 1976/04/20  Age: 47 y.o. MRN: [id number]  Chief Complaint  Patient presents with   Annual Exam    CPE. Pt is fasting. No concerns.     HPI Encounter Diagnoses  Name Primary?   Healthcare maintenance Yes   Stress at work    Mixed hyperlipidemia    Screening for diabetes mellitus (DM)    Insomnia, unspecified type    Screening for colon cancer    Family history of premature CAD    Here for follow-up of the above and physical exam.  Doing well.  Continues to workout at the gym and has regular dental care.  Lexapro 10 mg has greatly reduced his stress levels.  Continues with Ambien for insomnia.  Reports compliance with pravastatin for elevated LDL cholesterol and a family history of early coronary artery disease.   Review of Systems  Constitutional: Negative.   HENT: Negative.    Eyes:  Negative for blurred vision, discharge and redness.  Respiratory: Negative.    Cardiovascular: Negative.   Gastrointestinal:  Negative for abdominal pain.  Genitourinary: Negative.   Musculoskeletal: Negative.  Negative for myalgias.  Skin:  Negative for rash.  Neurological:  Negative for tingling, loss of consciousness and weakness.  Endo/Heme/Allergies:  Negative for polydipsia.      03/31/2023    8:19 AM 10/29/2022    8:45 AM 03/29/2022    3:12 PM  Depression screen PHQ 2/9  Decreased Interest 0 0 0  Down, Depressed, Hopeless 0 0 0  PHQ - 2 Score 0 0 0  Altered sleeping 0 2   Tired, decreased energy 0 2   Change in appetite 0 0   Feeling bad or failure about yourself  0 0   Trouble concentrating 0 1   Moving slowly or fidgety/restless 0 1   Suicidal thoughts 0 0   PHQ-9 Score 0 6   Difficult doing work/chores Not difficult at all Somewhat difficult       Current Outpatient Medications:    atorvastatin (LIPITOR) 20 MG tablet, Take 1 tablet (20 mg total) by mouth daily., Disp: 90 tablet,  Rfl: 3   EPINEPHrine 0.3 mg/0.3 mL IJ SOAJ injection, Inject 0.3 mg into the muscle as needed for anaphylaxis., Disp: 1 each, Rfl: 0   escitalopram (LEXAPRO) 10 MG tablet, TAKE 1 TABLET(10 MG) BY MOUTH DAILY, Disp: 90 tablet, Rfl: 1   famotidine (PEPCID) 20 MG tablet, Take 1 tab twice daily day before and day of [facility] appt., Disp: 4 tablet, Rfl: 0   fluticasone (FLONASE) 50 MCG/ACT nasal spray, Administer 1 spray in each nostril 2 times daily., Disp: , Rfl:    levocetirizine (XYZAL) 5 MG tablet, Take 1 tablet by mouth daily., Disp: , Rfl:    loratadine (CLARITIN) 10 MG tablet, Take 1 tablet by mouth daily., Disp: , Rfl:    montelukast (SINGULAIR) 10 MG tablet, Take 1 tablet (10 mg total) by mouth at bedtime., Disp: 90 tablet, Rfl: 1   omeprazole (PRILOSEC) 20 MG capsule, TAKE 1 CAPSULE(20 MG) BY MOUTH DAILY, Disp: 90 capsule, Rfl: 1   zolpidem (AMBIEN) 10 MG tablet, TAKE 1 TABLET(10 MG) BY MOUTH AT BEDTIME AS NEEDED FOR SLEEP, Disp: 90 tablet, Rfl: 1   Objective:     BP 124/86   Pulse 73   Temp 97.7 F (36.5 C)   Ht 5\' 9"  (1.753 m)   Wt  192 lb 9.6 oz (87.4 kg)   SpO2 98%   BMI 28.44 kg/m  BP Readings from Last 3 Encounters:  03/31/23 124/86  12/31/22 126/80  11/23/22 120/84   Wt Readings from Last 3 Encounters:  03/31/23 192 lb 9.6 oz (87.4 kg)  11/23/22 192 lb 11.2 oz (87.4 kg)  10/29/22 195 lb 6.4 oz (88.6 kg)      Physical Exam Constitutional:      General: He is not in acute distress.    Appearance: Normal appearance. He is not ill-appearing, toxic-appearing or diaphoretic.  HENT:     Head: Normocephalic and atraumatic.     Right Ear: Tympanic membrane, ear canal and external ear normal.     Left Ear: Tympanic membrane, ear canal and external ear normal.     Mouth/Throat:     Mouth: Mucous membranes are moist.     Pharynx: Oropharynx is clear. No oropharyngeal exudate or posterior oropharyngeal erythema.  Eyes:     General: No scleral icterus.       Right eye: No  discharge.        Left eye: No discharge.     Extraocular Movements: Extraocular movements intact.     Conjunctiva/sclera: Conjunctivae normal.     Pupils: Pupils are equal, round, and reactive to light.  Cardiovascular:     Rate and Rhythm: Normal rate and regular rhythm.  Pulmonary:     Effort: Pulmonary effort is normal. No respiratory distress.     Breath sounds: Normal breath sounds.  Abdominal:     General: Bowel sounds are normal.     Tenderness: There is no abdominal tenderness. There is no guarding.     Hernia: There is no hernia in the left inguinal area or right inguinal area.  Genitourinary:    Penis: Circumcised. No hypospadias, erythema, tenderness, discharge, swelling or lesions.      Testes:        Right: Mass, tenderness or swelling not present. Right testis is descended.        Left: Mass, tenderness or swelling not present. Left testis is descended.     Epididymis:     Right: Not inflamed or enlarged.     Left: Not inflamed.  Musculoskeletal:     Cervical back: No rigidity or tenderness.  Lymphadenopathy:     Cervical: No cervical adenopathy.     Lower Body: No right inguinal adenopathy. No left inguinal adenopathy.  Skin:    General: Skin is warm and dry.  Neurological:     Mental Status: He is alert and oriented to person, place, and time.  Psychiatric:        Mood and Affect: Mood normal.        Behavior: Behavior normal.      Results for orders placed or performed in visit on 03/31/23  CBC with Differential/Platelet  Result Value Ref Range   WBC 7.5 4.0 - 10.5 K/uL   RBC 5.10 4.22 - 5.81 Mil/uL   Hemoglobin 15.5 13.0 - 17.0 g/dL   HCT 82.9 56.2 - 13.0 %   MCV 91.3 78.0 - 100.0 fl   MCHC 33.3 30.0 - 36.0 g/dL   RDW 86.5 78.4 - 69.6 %   Platelets 294.0 150.0 - 400.0 K/uL   Neutrophils Relative % 52.5 43.0 - 77.0 %   Lymphocytes Relative 37.6 12.0 - 46.0 %   Monocytes Relative 5.2 3.0 - 12.0 %   Eosinophils Relative 4.1 0.0 - 5.0 %   Basophils  Relative 0.6 0.0 - 3.0 %   Neutro Abs 3.9 1.4 - 7.7 K/uL   Lymphs Abs 2.8 0.7 - 4.0 K/uL   Monocytes Absolute 0.4 0.1 - 1.0 K/uL   Eosinophils Absolute 0.3 0.0 - 0.7 K/uL   Basophils Absolute 0.0 0.0 - 0.1 K/uL  Comprehensive metabolic panel  Result Value Ref Range   Sodium 139 135 - 145 mEq/L   Potassium 4.5 3.5 - 5.1 mEq/L   Chloride 102 96 - 112 mEq/L   CO2 30 19 - 32 mEq/L   Glucose, Bld 93 70 - 99 mg/dL   BUN 12 6 - 23 mg/dL   Creatinine, Ser 1.61 0.40 - 1.50 mg/dL   Total Bilirubin 0.4 0.2 - 1.2 mg/dL   Alkaline Phosphatase 54 39 - 117 U/L   AST 15 0 - 37 U/L   ALT 17 0 - 53 U/L   Total Protein 6.9 6.0 - 8.3 g/dL   Albumin 4.5 3.5 - 5.2 g/dL   GFR 09.60 >45.40 mL/min   Calcium 9.5 8.4 - 10.5 mg/dL  Hemoglobin J8J  Result Value Ref Range   Hgb A1c MFr Bld 5.4 4.6 - 6.5 %  Lipid panel  Result Value Ref Range   Cholesterol 271 (H) 0 - 200 mg/dL   Triglycerides 191.4 (H) 0.0 - 149.0 mg/dL   HDL 78.29 >56.21 mg/dL   VLDL 30.8 0.0 - 65.7 mg/dL   LDL Cholesterol 846 (H) 0 - 99 mg/dL   Total CHOL/HDL Ratio 5    NonHDL 217.31   Urinalysis, Routine w reflex microscopic  Result Value Ref Range   Color, Urine YELLOW Yellow;Lt. Yellow;Straw;Dark Yellow;Amber;Green;Red;Brown   APPearance CLEAR Clear;Turbid;Slightly Cloudy;Cloudy   Specific Gravity, Urine 1.020 1.000 - 1.030   pH 7.0 5.0 - 8.0   Total Protein, Urine NEGATIVE Negative   Urine Glucose NEGATIVE Negative   Ketones, ur NEGATIVE Negative   Bilirubin Urine NEGATIVE Negative   Hgb urine dipstick NEGATIVE Negative   Urobilinogen, UA 1.0 0.0 - 1.0   Leukocytes,Ua NEGATIVE Negative   Nitrite NEGATIVE Negative   WBC, UA 0-2/hpf 0-2/hpf   RBC / HPF none seen 0-2/hpf   Mucus, UA Presence of (A) None      The 10-year ASCVD risk score (Arnett DK, et al., 2019) is: 2.7%    Assessment & Plan:   Healthcare maintenance -     CBC with Differential/Platelet -     Urinalysis, Routine w reflex microscopic  Stress at  work  Mixed hyperlipidemia -     Comprehensive metabolic panel -     Lipid panel -     Atorvastatin Calcium; Take 1 tablet (20 mg total) by mouth daily.  Dispense: 90 tablet; Refill: 3  Screening for diabetes mellitus (DM) -     Hemoglobin A1c  Insomnia, unspecified type  Screening for colon cancer -     Ambulatory referral to Gastroenterology  Family history of premature CAD    Return in about 6 months (around 10/01/2023).  Continue current medications.  Adjustments made pending lab results.  Will continue with 10 mg of Lexapro.  Patient feels as though it is sufficient for him at this time.  He continues with Ambien for sleep  Mliss Sax, MD     8/9 addendum: LDL remains elevated on pravastatin.  Have discontinued it.  Will start atorvastatin.

## 2023-04-01 ENCOUNTER — Telehealth: Payer: Self-pay

## 2023-04-01 MED ORDER — ATORVASTATIN CALCIUM 20 MG PO TABS: 20.0000 mg | ORAL_TABLET | Freq: Every day | ORAL | 3 refills | Status: DC

## 2023-04-01 NOTE — Addendum Note (Signed)
Addended by: Andrez Grime on: 04/01/2023 12:25 PM   Modules accepted: Orders

## 2023-04-04 ENCOUNTER — Encounter: Payer: Self-pay | Admitting: Family Medicine

## 2023-04-06 NOTE — Telephone Encounter (Signed)
Form has been signed and info/rmation place on form.  /Provider signed form, faxed to [phone] and copy of form given to his wife. Dm/cma

## 2023-04-06 NOTE — Telephone Encounter (Signed)
Sent 2 mychart messages and lmtrc.

## 2023-04-07 ENCOUNTER — Ambulatory Visit (INDEPENDENT_AMBULATORY_CARE_PROVIDER_SITE_OTHER): Payer: Managed Care, Other (non HMO)

## 2023-04-07 DIAGNOSIS — J309 Allergic rhinitis, unspecified: Secondary | ICD-10-CM | POA: Diagnosis not present

## 2023-04-14 ENCOUNTER — Ambulatory Visit (INDEPENDENT_AMBULATORY_CARE_PROVIDER_SITE_OTHER): Payer: Managed Care, Other (non HMO)

## 2023-04-14 DIAGNOSIS — J309 Allergic rhinitis, unspecified: Secondary | ICD-10-CM | POA: Diagnosis not present

## 2023-04-22 ENCOUNTER — Ambulatory Visit (INDEPENDENT_AMBULATORY_CARE_PROVIDER_SITE_OTHER): Payer: Managed Care, Other (non HMO)

## 2023-04-22 DIAGNOSIS — J309 Allergic rhinitis, unspecified: Secondary | ICD-10-CM | POA: Diagnosis not present

## 2023-04-29 ENCOUNTER — Ambulatory Visit (INDEPENDENT_AMBULATORY_CARE_PROVIDER_SITE_OTHER): Payer: Managed Care, Other (non HMO)

## 2023-04-29 DIAGNOSIS — J309 Allergic rhinitis, unspecified: Secondary | ICD-10-CM | POA: Diagnosis not present

## 2023-05-04 DIAGNOSIS — J3089 Other allergic rhinitis: Secondary | ICD-10-CM | POA: Diagnosis not present

## 2023-05-04 NOTE — Progress Notes (Signed)
VIALS EXP 05-03-24

## 2023-05-06 ENCOUNTER — Ambulatory Visit (INDEPENDENT_AMBULATORY_CARE_PROVIDER_SITE_OTHER): Payer: Managed Care, Other (non HMO)

## 2023-05-06 DIAGNOSIS — J309 Allergic rhinitis, unspecified: Secondary | ICD-10-CM

## 2023-05-12 ENCOUNTER — Ambulatory Visit (INDEPENDENT_AMBULATORY_CARE_PROVIDER_SITE_OTHER): Payer: Managed Care, Other (non HMO)

## 2023-05-12 DIAGNOSIS — J309 Allergic rhinitis, unspecified: Secondary | ICD-10-CM | POA: Diagnosis not present

## 2023-05-23 ENCOUNTER — Ambulatory Visit (INDEPENDENT_AMBULATORY_CARE_PROVIDER_SITE_OTHER): Payer: Managed Care, Other (non HMO)

## 2023-05-23 DIAGNOSIS — J309 Allergic rhinitis, unspecified: Secondary | ICD-10-CM | POA: Diagnosis not present

## 2023-05-24 ENCOUNTER — Ambulatory Visit: Payer: Managed Care, Other (non HMO) | Admitting: Family Medicine

## 2023-05-24 VITALS — BP 128/76 | HR 61 | Wt 193.2 lb

## 2023-05-24 DIAGNOSIS — F419 Anxiety disorder, unspecified: Secondary | ICD-10-CM | POA: Diagnosis not present

## 2023-05-24 DIAGNOSIS — R197 Diarrhea, unspecified: Secondary | ICD-10-CM | POA: Insufficient documentation

## 2023-05-24 DIAGNOSIS — K219 Gastro-esophageal reflux disease without esophagitis: Secondary | ICD-10-CM

## 2023-05-24 DIAGNOSIS — Z566 Other physical and mental strain related to work: Secondary | ICD-10-CM

## 2023-05-24 LAB — CBC WITH DIFFERENTIAL/PLATELET
Basophils Absolute: 0 10*3/uL (ref 0.0–0.1)
Basophils Relative: 0.6 % (ref 0.0–3.0)
Eosinophils Absolute: 0.3 10*3/uL (ref 0.0–0.7)
Eosinophils Relative: 4 % (ref 0.0–5.0)
HCT: 45.2 % (ref 39.0–52.0)
Hemoglobin: 15.1 g/dL (ref 13.0–17.0)
Lymphocytes Relative: 38.7 % (ref 12.0–46.0)
Lymphs Abs: 3 10*3/uL (ref 0.7–4.0)
MCHC: 33.5 g/dL (ref 30.0–36.0)
MCV: 90.2 fL (ref 78.0–100.0)
Monocytes Absolute: 0.4 10*3/uL (ref 0.1–1.0)
Monocytes Relative: 5.7 % (ref 3.0–12.0)
Neutro Abs: 4 10*3/uL (ref 1.4–7.7)
Neutrophils Relative %: 51 % (ref 43.0–77.0)
Platelets: 286 10*3/uL (ref 150.0–400.0)
RBC: 5.01 Mil/uL (ref 4.22–5.81)
RDW: 12.9 % (ref 11.5–15.5)
WBC: 7.9 10*3/uL (ref 4.0–10.5)

## 2023-05-24 LAB — COMPREHENSIVE METABOLIC PANEL
ALT: 27 U/L (ref 0–53)
AST: 19 U/L (ref 0–37)
Albumin: 4.4 g/dL (ref 3.5–5.2)
Alkaline Phosphatase: 62 U/L (ref 39–117)
BUN: 9 mg/dL (ref 6–23)
CO2: 28 meq/L (ref 19–32)
Calcium: 9.4 mg/dL (ref 8.4–10.5)
Chloride: 104 meq/L (ref 96–112)
Creatinine, Ser: 1 mg/dL (ref 0.40–1.50)
GFR: 91.31 mL/min (ref >60.00)
Glucose, Bld: 94 mg/dL (ref 70–99)
Potassium: 4.3 meq/L (ref 3.5–5.1)
Sodium: 139 meq/L (ref 135–145)
Total Bilirubin: 0.6 mg/dL (ref 0.2–1.2)
Total Protein: 6.6 g/dL (ref 6.0–8.3)

## 2023-05-24 MED ORDER — ESCITALOPRAM OXALATE 20 MG PO TABS
20.0000 mg | ORAL_TABLET | Freq: Every day | ORAL | 1 refills | Status: DC
Start: 2023-05-24 — End: 2024-01-06

## 2023-05-24 NOTE — Progress Notes (Signed)
Established Patient Office Visit   Subjective:  Patient ID: JAELYNN CURRIER, male    DOB: 03/13/1976  Age: 47 y.o. MRN: [id number]  Chief Complaint  Patient presents with   Abdominal Pain    2 weeks; stomach pain, diarrhea, "gurgling" stomach. Denies Nausea/vomiting.  Pt states he has tried [employer] and an OTC anti-diarrhea, neither have touched the issue.     Abdominal Pain Associated symptoms include diarrhea. Pertinent negatives include no constipation, melena or myalgias.   Encounter Diagnoses  Name Primary?   Stress at work Yes   Anxiety    Gastroesophageal reflux disease without esophagitis    Diarrhea, unspecified type    2-week history of loose to watery stools.  He has seen no blood or pus in his stools.  There have been no fever or chills.  There has been no weight loss.  Mild abdominal pain.  Scheduled for his first colonoscopy.  No one else at home is affected.  Stress at work is ongoing.  GERD symptoms are controlled well with famotidine and an occasional Pepcid.  Quit smoking over a year ago.   Review of Systems  Constitutional: Negative.   HENT: Negative.    Eyes:  Negative for blurred vision, discharge and redness.  Respiratory: Negative.    Cardiovascular: Negative.   Gastrointestinal:  Positive for abdominal pain and diarrhea. Negative for blood in stool, constipation and melena.  Genitourinary: Negative.   Musculoskeletal: Negative.  Negative for myalgias.  Skin:  Negative for rash.  Neurological:  Negative for tingling, loss of consciousness and weakness.  Endo/Heme/Allergies:  Negative for polydipsia.     Current Outpatient Medications:    atorvastatin (LIPITOR) 20 MG tablet, Take 1 tablet (20 mg total) by mouth daily., Disp: 90 tablet, Rfl: 3   EPINEPHrine 0.3 mg/0.3 mL IJ SOAJ injection, Inject 0.3 mg into the muscle as needed for anaphylaxis., Disp: 1 each, Rfl: 0   escitalopram (LEXAPRO) 20 MG tablet, Take 1 tablet (20 mg total) by mouth  daily., Disp: 90 tablet, Rfl: 1   famotidine (PEPCID) 20 MG tablet, Take 1 tab twice daily day before and day of [facility] appt., Disp: 4 tablet, Rfl: 0   fluticasone (FLONASE) 50 MCG/ACT nasal spray, Administer 1 spray in each nostril 2 times daily., Disp: , Rfl:    levocetirizine (XYZAL) 5 MG tablet, Take 1 tablet by mouth daily., Disp: , Rfl:    loratadine (CLARITIN) 10 MG tablet, Take 1 tablet by mouth daily., Disp: , Rfl:    montelukast (SINGULAIR) 10 MG tablet, Take 1 tablet (10 mg total) by mouth at bedtime., Disp: 90 tablet, Rfl: 1   omeprazole (PRILOSEC) 20 MG capsule, TAKE 1 CAPSULE(20 MG) BY MOUTH DAILY, Disp: 90 capsule, Rfl: 1   zolpidem (AMBIEN) 10 MG tablet, TAKE 1 TABLET(10 MG) BY MOUTH AT BEDTIME AS NEEDED FOR SLEEP, Disp: 90 tablet, Rfl: 1   Objective:     BP 128/76   Pulse 61   Wt 193 lb 3.2 oz (87.6 kg)   SpO2 99%   BMI 28.53 kg/m  Wt Readings from Last 3 Encounters:  05/24/23 193 lb 3.2 oz (87.6 kg)  03/31/23 192 lb 9.6 oz (87.4 kg)  11/23/22 192 lb 11.2 oz (87.4 kg)      Physical Exam Constitutional:      General: He is not in acute distress.    Appearance: Normal appearance. He is not ill-appearing, toxic-appearing or diaphoretic.  HENT:     Head: Normocephalic and  atraumatic.     Right Ear: External ear normal.     Left Ear: External ear normal.     Mouth/Throat:     Mouth: Mucous membranes are moist.     Pharynx: Oropharynx is clear. No oropharyngeal exudate or posterior oropharyngeal erythema.  Eyes:     General: No scleral icterus.       Right eye: No discharge.        Left eye: No discharge.     Extraocular Movements: Extraocular movements intact.     Conjunctiva/sclera: Conjunctivae normal.     Pupils: Pupils are equal, round, and reactive to light.  Cardiovascular:     Rate and Rhythm: Normal rate and regular rhythm.  Pulmonary:     Effort: Pulmonary effort is normal. No respiratory distress.     Breath sounds: Normal breath sounds. No wheezing  or rhonchi.  Abdominal:     General: Abdomen is flat. Bowel sounds are normal. There is no distension.     Palpations: Abdomen is soft.     Tenderness: There is no abdominal tenderness. There is no guarding.     Hernia: There is no hernia in the umbilical area or ventral area.  Musculoskeletal:     Cervical back: No rigidity or tenderness.  Skin:    General: Skin is warm and dry.  Neurological:     Mental Status: He is alert and oriented to person, place, and time.  Psychiatric:        Mood and Affect: Mood normal.        Behavior: Behavior normal.      No results found for any visits on 05/24/23.    The 10-year ASCVD risk score (Arnett DK, et al., 2019) is: 2.8%    Assessment & Plan:   Stress at work -     Escitalopram Oxalate; Take 1 tablet (20 mg total) by mouth daily.  Dispense: 90 tablet; Refill: 1  Anxiety -     Escitalopram Oxalate; Take 1 tablet (20 mg total) by mouth daily.  Dispense: 90 tablet; Refill: 1  Gastroesophageal reflux disease without esophagitis  Diarrhea, unspecified type -     CBC with Differential/Platelet -     Comprehensive metabolic panel -     Gastrointestinal Panel by PCR , Stool; Future    Return if symptoms worsen or fail to improve.  Suspect irritable bowel.  Have increased Lexapro to 20 mg daily.  Follow-up in the next month or so if not improving, otherwise in February as planned.  Mliss Sax, MD

## 2023-05-25 ENCOUNTER — Encounter: Payer: Self-pay | Admitting: Family Medicine

## 2023-06-01 ENCOUNTER — Ambulatory Visit (INDEPENDENT_AMBULATORY_CARE_PROVIDER_SITE_OTHER): Payer: Managed Care, Other (non HMO)

## 2023-06-01 ENCOUNTER — Other Ambulatory Visit: Payer: Self-pay

## 2023-06-01 DIAGNOSIS — J309 Allergic rhinitis, unspecified: Secondary | ICD-10-CM | POA: Diagnosis not present

## 2023-06-01 DIAGNOSIS — R197 Diarrhea, unspecified: Secondary | ICD-10-CM

## 2023-06-01 NOTE — Telephone Encounter (Addendum)
Original order for the GI panel did not have a correct order. Patient's wife came to the office to pick up stool kit.

## 2023-06-01 NOTE — Progress Notes (Signed)
Pt called and notified that the correct order has now been placed and the kit can be picked up.

## 2023-06-09 ENCOUNTER — Ambulatory Visit (INDEPENDENT_AMBULATORY_CARE_PROVIDER_SITE_OTHER): Payer: Managed Care, Other (non HMO)

## 2023-06-09 DIAGNOSIS — J309 Allergic rhinitis, unspecified: Secondary | ICD-10-CM

## 2023-06-17 ENCOUNTER — Other Ambulatory Visit: Payer: Self-pay | Admitting: Family Medicine

## 2023-06-17 DIAGNOSIS — I10 Essential (primary) hypertension: Secondary | ICD-10-CM

## 2023-06-17 DIAGNOSIS — Z566 Other physical and mental strain related to work: Secondary | ICD-10-CM

## 2023-06-20 ENCOUNTER — Ambulatory Visit (INDEPENDENT_AMBULATORY_CARE_PROVIDER_SITE_OTHER): Payer: Managed Care, Other (non HMO)

## 2023-06-20 ENCOUNTER — Other Ambulatory Visit: Payer: Self-pay | Admitting: Family Medicine

## 2023-06-20 ENCOUNTER — Encounter: Payer: Self-pay | Admitting: Family Medicine

## 2023-06-20 DIAGNOSIS — G4700 Insomnia, unspecified: Secondary | ICD-10-CM

## 2023-06-20 DIAGNOSIS — J309 Allergic rhinitis, unspecified: Secondary | ICD-10-CM | POA: Diagnosis not present

## 2023-06-20 MED ORDER — OLMESARTAN MEDOXOMIL 20 MG PO TABS
20.0000 mg | ORAL_TABLET | Freq: Every day | ORAL | 1 refills | Status: DC
Start: 2023-06-20 — End: 2024-01-06

## 2023-06-20 NOTE — Addendum Note (Signed)
Addended by: Andrez Grime on: 06/20/2023 02:22 PM   Modules accepted: Orders

## 2023-06-21 MED ORDER — ZOLPIDEM TARTRATE 10 MG PO TABS
10.0000 mg | ORAL_TABLET | Freq: Every evening | ORAL | 1 refills | Status: DC | PRN
Start: 2023-06-21 — End: 2023-12-13

## 2023-06-30 ENCOUNTER — Ambulatory Visit (INDEPENDENT_AMBULATORY_CARE_PROVIDER_SITE_OTHER): Payer: Managed Care, Other (non HMO)

## 2023-06-30 DIAGNOSIS — J309 Allergic rhinitis, unspecified: Secondary | ICD-10-CM

## 2023-07-18 ENCOUNTER — Encounter: Payer: Self-pay | Admitting: Gastroenterology

## 2023-07-18 ENCOUNTER — Ambulatory Visit (AMBULATORY_SURGERY_CENTER): Payer: Managed Care, Other (non HMO) | Admitting: *Deleted

## 2023-07-18 ENCOUNTER — Ambulatory Visit (INDEPENDENT_AMBULATORY_CARE_PROVIDER_SITE_OTHER): Payer: Self-pay

## 2023-07-18 VITALS — Ht 69.0 in | Wt 190.0 lb

## 2023-07-18 DIAGNOSIS — Z1211 Encounter for screening for malignant neoplasm of colon: Secondary | ICD-10-CM

## 2023-07-18 DIAGNOSIS — J309 Allergic rhinitis, unspecified: Secondary | ICD-10-CM

## 2023-07-18 MED ORDER — NA SULFATE-K SULFATE-MG SULF 17.5-3.13-1.6 GM/177ML PO SOLN: 1.0000 | Freq: Once | ORAL | 0 refills | Status: AC

## 2023-07-18 NOTE — Progress Notes (Signed)
Pt's name and DOB verified at the beginning of the pre-visit wit 2 identifiers  Pt denies any difficulty with ambulating,sitting, laying down or rolling side to side  Pt has issues with ambulation   Pt has no issues moving head neck or swallowing  No egg or soy allergy known to patient   No issues known to pt with past sedation with any surgeries or procedures  Pt denies having issues being intubated  No FH of Malignant Hyperthermia  Pt is not on diet pills or shots  Pt is not on home 02   Pt is not on blood thinners   Pt denies issues with constipation   Pt is not on dialysis  Pt denise any abnormal heart rhythms   Pt denies any upcoming cardiac testing  Pt encouraged to use to use Singlecare or Goodrx to reduce cost   Patient's chart reviewed by Cathlyn Parsons CNRA prior to pre-visit and patient appropriate for the [facility].  Pre-visit completed and red dot placed by patient's name on their procedure day (on provider's schedule).  .  Visit by phone  Pt states weight is 190 lb   Instructed pt why it is important to and  to call if they have any changes in health or new medications. Directed them to the # given and on instructions.     Instructions reviewed. Pt given both [facility] main # and MD on call # prior to instructions.  Pt states understanding. Instructed to review again prior to procedure. Pt states they will.   Instructions sent by mail with coupon and by My Chart  Instructions and coupon given to pt   Instructions sent through My Chart  Coupon sent via text to mobile phone and pt verified they received it

## 2023-08-01 ENCOUNTER — Ambulatory Visit (AMBULATORY_SURGERY_CENTER): Payer: Managed Care, Other (non HMO) | Admitting: Gastroenterology

## 2023-08-01 ENCOUNTER — Encounter: Payer: Self-pay | Admitting: Gastroenterology

## 2023-08-01 VITALS — BP 117/73 | HR 52 | Temp 97.3°F | Resp 11 | Ht 69.0 in | Wt 190.0 lb

## 2023-08-01 DIAGNOSIS — K641 Second degree hemorrhoids: Secondary | ICD-10-CM

## 2023-08-01 DIAGNOSIS — D125 Benign neoplasm of sigmoid colon: Secondary | ICD-10-CM | POA: Diagnosis not present

## 2023-08-01 DIAGNOSIS — K635 Polyp of colon: Secondary | ICD-10-CM | POA: Diagnosis not present

## 2023-08-01 DIAGNOSIS — K5730 Diverticulosis of large intestine without perforation or abscess without bleeding: Secondary | ICD-10-CM | POA: Diagnosis not present

## 2023-08-01 DIAGNOSIS — Z1211 Encounter for screening for malignant neoplasm of colon: Secondary | ICD-10-CM

## 2023-08-01 DIAGNOSIS — D122 Benign neoplasm of ascending colon: Secondary | ICD-10-CM

## 2023-08-01 DIAGNOSIS — K648 Other hemorrhoids: Secondary | ICD-10-CM

## 2023-08-01 DIAGNOSIS — D128 Benign neoplasm of rectum: Secondary | ICD-10-CM | POA: Diagnosis not present

## 2023-08-01 DIAGNOSIS — D127 Benign neoplasm of rectosigmoid junction: Secondary | ICD-10-CM | POA: Diagnosis not present

## 2023-08-01 MED ORDER — SODIUM CHLORIDE 0.9 % IV SOLN: 500.0000 mL | Freq: Once | INTRAVENOUS | Status: DC

## 2023-08-01 NOTE — Patient Instructions (Signed)
Handouts provided on polyps, diverticulosis and hemorrhoids.  Resume previous diet.  Continue present medications.  Await pathology results.  Repeat colonoscopy for surveillance based on pathology results.  Return to GI office as needed.   YOU HAD AN ENDOSCOPIC PROCEDURE TODAY AT THE [facility]:   Refer to the procedure report that was given to you for any specific questions about what was found during the examination.  If the procedure report does not answer your questions, please call your gastroenterologist to clarify.  If you requested that your care partner not be given the details of your procedure findings, then the procedure report has been included in a sealed envelope for you to review at your convenience later.  YOU SHOULD EXPECT: Some feelings of bloating in the abdomen. Passage of more gas than usual.  Walking can help get rid of the air that was put into your GI tract during the procedure and reduce the bloating. If you had a lower endoscopy (such as a colonoscopy or flexible sigmoidoscopy) you may notice spotting of blood in your stool or on the toilet paper. If you underwent a bowel prep for your procedure, you may not have a normal bowel movement for a few days.  Please Note:  You might notice some irritation and congestion in your nose or some drainage.  This is from the oxygen used during your procedure.  There is no need for concern and it should clear up in a day or so.  SYMPTOMS TO REPORT IMMEDIATELY:  Following lower endoscopy (colonoscopy or flexible sigmoidoscopy):  Excessive amounts of blood in the stool  Significant tenderness or worsening of abdominal pains  Swelling of the abdomen that is new, acute  Fever of 100F or higher  For urgent or emergent issues, a gastroenterologist can be reached at any hour by calling [phone]. Do not use MyChart messaging for urgent concerns.    DIET:  We do recommend a small meal at first, but then you may  proceed to your regular diet.  Drink plenty of fluids but you should avoid alcoholic beverages for 24 hours.  ACTIVITY:  You should plan to take it easy for the rest of today and you should NOT DRIVE or use heavy machinery until tomorrow (because of the sedation medicines used during the test).    FOLLOW UP: Our staff will call the number listed on your records the next business day following your procedure.  We will call around 7:15- 8:00 am to check on you and address any questions or concerns that you may have regarding the information given to you following your procedure. If we do not reach you, we will leave a message.     If any biopsies were taken you will be contacted by phone or by letter within the next 1-3 weeks.  Please call us at [phone] if you have not heard about the biopsies in 3 weeks.    SIGNATURES/CONFIDENTIALITY: You and/or your care partner have signed paperwork which will be entered into your electronic medical record.  These signatures attest to the fact that that the information above on your After Visit Summary has been reviewed and is understood.  Full responsibility of the confidentiality of this discharge information lies with you and/or your care-partner.

## 2023-08-01 NOTE — Progress Notes (Signed)
Pt resting comfortably. VSS. Airway intact. SBAR complete to RN. All questions answered.   

## 2023-08-01 NOTE — Progress Notes (Signed)
Called to room to assist during endoscopic procedure.  Patient ID and intended procedure confirmed with present staff. Received instructions for my participation in the procedure from the performing physician.  

## 2023-08-01 NOTE — Progress Notes (Signed)
Pt's states no medical or surgical changes since previsit or office visit. 

## 2023-08-01 NOTE — Op Note (Signed)
[facility] Patient Name: Cory Hoffman Procedure Date: 08/01/2023 8:18 AM MRN: [id number] Endoscopist: Doristine Locks , MD, [id number] Age: 47 Referring MD:  Date of Birth: Oct 04, 1975 Gender: Male Account #: [id number] Procedure:                Colonoscopy Indications:              Screening for colorectal malignant neoplasm, This                            is the patient's first colonoscopy Medicines:                Monitored Anesthesia Care Procedure:                Pre-Anesthesia Assessment:                           - Prior to the procedure, a History and Physical                            was performed, and patient medications and                            allergies were reviewed. The patient's tolerance of                            previous anesthesia was also reviewed. The risks                            and benefits of the procedure and the sedation                            options and risks were discussed with the patient.                            All questions were answered, and informed consent                            was obtained. Prior Anticoagulants: The patient has                            taken no anticoagulant or antiplatelet agents. ASA                            Grade Assessment: II - A patient with mild systemic                            disease. After reviewing the risks and benefits,                            the patient was deemed in satisfactory condition to                            undergo the procedure.  After obtaining informed consent, the colonoscope                            was passed under direct vision. Throughout the                            procedure, the patient's blood pressure, pulse, and                            oxygen saturations were monitored continuously. The                            Olympus Scope SN: [id number] was introduced through                            the anus and advanced  to the the terminal ileum.                            The colonoscopy was performed without difficulty.                            The patient tolerated the procedure well. The                            quality of the bowel preparation was good. The                            terminal ileum, ileocecal valve, appendiceal                            orifice, and rectum were photographed. Scope In: 8:36:21 AM Scope Out: 8:49:49 AM Scope Withdrawal Time: 0 hours 12 minutes 11 seconds  Total Procedure Duration: 0 hours 13 minutes 28 seconds  Findings:                 The perianal and digital rectal examinations were                            normal.                           A 2 mm polyp was found in the ascending colon. The                            polyp was sessile. The polyp was removed with a                            cold snare. Resection and retrieval were complete.                            Estimated blood loss was minimal.                           Four sessile polyps were found in the rectum (1)  and distal sigmoid colon (3). The polyps were 3 to                            5 mm in size. These polyps were removed with a cold                            snare. Resection and retrieval were complete.                            Estimated blood loss was minimal.                           A few small-mouthed diverticula were found in the                            sigmoid colon.                           Non-bleeding internal hemorrhoids were found during                            retroflexion. The hemorrhoids were small.                           The terminal ileum appeared normal. Complications:            No immediate complications. Estimated Blood Loss:     Estimated blood loss was minimal. Impression:               - One 2 mm polyp in the ascending colon, removed                            with a cold snare. Resected and retrieved.                            - Four 3 to 5 mm polyps in the rectum and in the                            distal sigmoid colon, removed with a cold snare.                            Resected and retrieved.                           - Diverticulosis in the sigmoid colon.                           - Non-bleeding internal hemorrhoids.                           - The examined portion of the ileum was normal. Recommendation:           - Patient has a contact number available for  emergencies. The signs and symptoms of potential                            delayed complications were discussed with the                            patient. Return to normal activities tomorrow.                            Written discharge instructions were provided to the                            patient.                           - Resume previous diet.                           - Continue present medications.                           - Await pathology results.                           - Repeat colonoscopy for surveillance based on                            pathology results.                           - Return to GI office PRN. Doristine Locks, MD 08/01/2023 8:54:25 AM

## 2023-08-01 NOTE — Progress Notes (Signed)
GASTROENTEROLOGY PROCEDURE H&P NOTE   Primary Care Physician: Mliss Sax, MD    Reason for Procedure:  Colon Cancer screening  Plan:    Colonoscopy  Patient is appropriate for endoscopic procedure(s) in the ambulatory ([facility]) setting.  The nature of the procedure, as well as the risks, benefits, and alternatives were carefully and thoroughly reviewed with the patient. Ample time for discussion and questions allowed. The patient understood, was satisfied, and agreed to proceed.     HPI: Cory Hoffman is a 47 y.o. male who presents for colonoscopy for routine Colon Cancer screening.  No active GI symptoms.  No known family history of colon cancer or related malignancy.  Patient is otherwise without complaints or active issues today.  Past Medical History:  Diagnosis Date   Chronic headaches    Frequent headaches    HTN (hypertension)    Hyperlipidemia    Migraines     Past Surgical History:  Procedure Laterality Date   BACK SURGERY     KNEE ARTHROSCOPY     WISDOM TOOTH EXTRACTION      Prior to Admission medications   Medication Sig Start Date End Date Taking? Authorizing Provider  atorvastatin (LIPITOR) 20 MG tablet Take 1 tablet (20 mg total) by mouth daily. 04/01/23  Yes Mliss Sax, MD  escitalopram (LEXAPRO) 20 MG tablet Take 1 tablet (20 mg total) by mouth daily. 05/24/23  Yes Mliss Sax, MD  famotidine (PEPCID) 20 MG tablet Take 1 tab twice daily day before and day of [facility] appt. 12/29/22  Yes Ferol Luz, MD  fluticasone (FLONASE) 50 MCG/ACT nasal spray Administer 1 spray in each nostril 2 times daily. 10/04/22  Yes [provider]  levocetirizine (XYZAL) 5 MG tablet Take 1 tablet by mouth daily.   Yes [provider]  loratadine (CLARITIN) 10 MG tablet Take 1 tablet by mouth daily. One or the other of the allergy meds   Yes [provider]  olmesartan (BENICAR) 20 MG tablet Take 1 tablet (20 mg total)  by mouth daily. 06/20/23  Yes Mliss Sax, MD  omeprazole (PRILOSEC) 20 MG capsule TAKE 1 CAPSULE(20 MG) BY MOUTH DAILY 03/22/23  Yes Mliss Sax, MD  zolpidem (AMBIEN) 10 MG tablet Take 1 tablet (10 mg total) by mouth at bedtime as needed for sleep. 06/21/23  Yes Mliss Sax, MD  EPINEPHrine 0.3 mg/0.3 mL IJ SOAJ injection Inject 0.3 mg into the muscle as needed for anaphylaxis. Patient not taking: Reported on 08/01/2023 12/29/22   Ferol Luz, MD  montelukast (SINGULAIR) 10 MG tablet Take 1 tablet (10 mg total) by mouth at bedtime. Patient not taking: Reported on 07/18/2023 11/23/22   Ferol Luz, MD    Current Outpatient Medications  Medication Sig Dispense Refill   atorvastatin (LIPITOR) 20 MG tablet Take 1 tablet (20 mg total) by mouth daily. 90 tablet 3   escitalopram (LEXAPRO) 20 MG tablet Take 1 tablet (20 mg total) by mouth daily. 90 tablet 1   famotidine (PEPCID) 20 MG tablet Take 1 tab twice daily day before and day of [facility] appt. 4 tablet 0   fluticasone (FLONASE) 50 MCG/ACT nasal spray Administer 1 spray in each nostril 2 times daily.     levocetirizine (XYZAL) 5 MG tablet Take 1 tablet by mouth daily.     loratadine (CLARITIN) 10 MG tablet Take 1 tablet by mouth daily. One or the other of the allergy meds     olmesartan (BENICAR) 20  MG tablet Take 1 tablet (20 mg total) by mouth daily. 90 tablet 1   omeprazole (PRILOSEC) 20 MG capsule TAKE 1 CAPSULE(20 MG) BY MOUTH DAILY 90 capsule 1   zolpidem (AMBIEN) 10 MG tablet Take 1 tablet (10 mg total) by mouth at bedtime as needed for sleep. 90 tablet 1   EPINEPHrine 0.3 mg/0.3 mL IJ SOAJ injection Inject 0.3 mg into the muscle as needed for anaphylaxis. (Patient not taking: Reported on 08/01/2023) 1 each 0   montelukast (SINGULAIR) 10 MG tablet Take 1 tablet (10 mg total) by mouth at bedtime. (Patient not taking: Reported on 07/18/2023) 90 tablet 1   Current Facility-Administered Medications   Medication Dose Route Frequency Provider Last Rate Last Admin   0.9 %  sodium chloride infusion  500 mL Intravenous Once Yuriana Gaal V, DO        Allergies as of 08/01/2023   (No Known Allergies)    Family History  Problem Relation Age of Onset   Heart disease Mother    Cancer - Lung Mother    Heart disease Father    Stroke Paternal Grandmother    Heart attack Paternal Grandfather        Mid 24s   Colon cancer Neg Hx    Colon polyps Neg Hx    Esophageal cancer Neg Hx    Rectal cancer Neg Hx    Stomach cancer Neg Hx     Social History   Socioeconomic History   Marital status: Married    Spouse name: Not on file   Number of children: 4   Years of education: 16   Highest education level: 12th grade  Occupational History   Occupation: Data processing manager  Tobacco Use   Smoking status: Former    Current packs/day: 0.00    Average packs/day: 1 pack/day for 15.0 years (15.0 ttl pk-yrs)    Types: Cigarettes    Start date: 01/27/2007    Quit date: 01/26/2022    Years since quitting: 1.5   Smokeless tobacco: Never  Vaping Use   Vaping status: Never Used  Substance and Sexual Activity   Alcohol use: Yes    Alcohol/week: 0.0 standard drinks of alcohol    Comment: up to 3-4 drinks in a given setting sporadically.   Drug use: No   Sexual activity: Yes  Other Topics Concern   Not on file  Social History Narrative   Fun/Hobby: Racing, drag racing.    Social Determinants of Health   Financial Resource Strain: Low Risk  (05/24/2023)   Overall Financial Resource Strain (CARDIA)    Difficulty of Paying Living Expenses: Not hard at all  Food Insecurity: No Food Insecurity (05/24/2023)   Hunger Vital Sign    Worried About Running Out of Food in the Last Year: Never true    Ran Out of Food in the Last Year: Never true  Transportation Needs: No Transportation Needs (05/24/2023)   PRAPARE - Administrator, Civil Service (Medical): No    Lack of Transportation  (Non-Medical): No  Physical Activity: Insufficiently Active (05/24/2023)   Exercise Vital Sign    Days of Exercise per Week: 1 day    Minutes of Exercise per Session: 30 min  Stress: No Stress Concern Present (05/24/2023)   [employer] of Occupational Health - Occupational Stress Questionnaire    Feeling of Stress : Not at all  Social Connections: Moderately Integrated (05/24/2023)   Social Connection and Isolation Panel [NHANES]    Frequency  of Communication with Friends and Family: Twice a week    Frequency of Social Gatherings with Friends and Family: Once a week    Attends Religious Services: 1 to 4 times per year    Active Member of [employer] or Organizations: No    Attends Engineer, structural: Not on file    Marital Status: Married  Catering manager Violence: Not on file    Physical Exam: Vital signs in last 24 hours: @BP  (!) 135/58   Pulse (!) 58   Temp (!) 97.3 F (36.3 C) (Temporal)   Ht 5\' 9"  (1.753 m)   Wt 190 lb (86.2 kg)   SpO2 100%   BMI 28.06 kg/m  GEN: NAD EYE: Sclerae anicteric ENT: MMM CV: Non-tachycardic Pulm: CTA b/l GI: Soft, NT/ND NEURO:  Alert & Oriented x 3   Doristine Locks, DO [facility]   08/01/2023 8:23 AM

## 2023-08-02 ENCOUNTER — Telehealth: Payer: Self-pay

## 2023-08-02 NOTE — Telephone Encounter (Signed)
LMOM

## 2023-08-03 LAB — SURGICAL PATHOLOGY

## 2023-08-05 ENCOUNTER — Ambulatory Visit (INDEPENDENT_AMBULATORY_CARE_PROVIDER_SITE_OTHER): Payer: Self-pay | Admitting: *Deleted

## 2023-08-05 DIAGNOSIS — J309 Allergic rhinitis, unspecified: Secondary | ICD-10-CM

## 2023-08-07 ENCOUNTER — Encounter: Payer: Self-pay | Admitting: Gastroenterology

## 2023-08-22 DIAGNOSIS — J3089 Other allergic rhinitis: Secondary | ICD-10-CM | POA: Diagnosis not present

## 2023-08-22 NOTE — Progress Notes (Signed)
VIALS EXP 08-21-24

## 2023-08-23 ENCOUNTER — Other Ambulatory Visit: Payer: Self-pay | Admitting: Family Medicine

## 2023-08-23 DIAGNOSIS — Z566 Other physical and mental strain related to work: Secondary | ICD-10-CM

## 2023-08-23 DIAGNOSIS — F419 Anxiety disorder, unspecified: Secondary | ICD-10-CM

## 2023-08-26 ENCOUNTER — Ambulatory Visit (INDEPENDENT_AMBULATORY_CARE_PROVIDER_SITE_OTHER): Payer: Self-pay

## 2023-08-26 DIAGNOSIS — J309 Allergic rhinitis, unspecified: Secondary | ICD-10-CM

## 2023-10-03 ENCOUNTER — Ambulatory Visit (INDEPENDENT_AMBULATORY_CARE_PROVIDER_SITE_OTHER): Payer: Self-pay

## 2023-10-03 DIAGNOSIS — J309 Allergic rhinitis, unspecified: Secondary | ICD-10-CM | POA: Diagnosis not present

## 2023-10-11 ENCOUNTER — Encounter: Payer: Self-pay | Admitting: Family Medicine

## 2023-10-25 ENCOUNTER — Other Ambulatory Visit: Payer: Self-pay | Admitting: Family Medicine

## 2023-10-25 DIAGNOSIS — K219 Gastro-esophageal reflux disease without esophagitis: Secondary | ICD-10-CM

## 2023-10-28 ENCOUNTER — Ambulatory Visit (INDEPENDENT_AMBULATORY_CARE_PROVIDER_SITE_OTHER): Payer: Self-pay

## 2023-10-28 DIAGNOSIS — J309 Allergic rhinitis, unspecified: Secondary | ICD-10-CM | POA: Diagnosis not present

## 2023-11-04 ENCOUNTER — Encounter: Payer: Self-pay | Admitting: Physician Assistant

## 2023-11-04 ENCOUNTER — Ambulatory Visit: Payer: Self-pay | Admitting: Family Medicine

## 2023-11-04 ENCOUNTER — Ambulatory Visit: Admitting: Physician Assistant

## 2023-11-04 VITALS — BP 140/95 | HR 71 | Temp 98.7°F | Resp 16 | Ht 69.0 in | Wt 202.0 lb

## 2023-11-04 DIAGNOSIS — J069 Acute upper respiratory infection, unspecified: Secondary | ICD-10-CM

## 2023-11-04 DIAGNOSIS — R051 Acute cough: Secondary | ICD-10-CM

## 2023-11-04 LAB — POCT INFLUENZA A/B
Influenza A, POC: NEGATIVE
Influenza B, POC: NEGATIVE

## 2023-11-04 MED ORDER — PREDNISONE 20 MG PO TABS: 20.0000 mg | ORAL_TABLET | Freq: Every day | ORAL | 0 refills | Status: AC

## 2023-11-04 NOTE — Telephone Encounter (Signed)
 Copied from CRM [id number]. Topic: Clinical - Red Word Triage >> Nov 04, 2023  8:03 AM Kathryne Eriksson wrote: Red Word that prompted transfer to Nurse Triage: Fever >> Nov 04, 2023  8:05 AM Kathryne Eriksson wrote: Patient's wife "Beatrice Sehgal" called on behalf of patient states he's extremely congested, severe coughing and running fevers off and on.    Chief Complaint: cough Symptoms: fever Frequency: constant Pertinent Negatives: Patient denies shortness of breath Disposition: [] ED /[] Urgent Care (no appt availability in office) / [x] Appointment(In office/virtual)/ []  [place] [facility]/ [] Home Care/ [] Refused Recommended Disposition /[] [place] [facility]/ []  Follow-up with PCP Additional Notes: cough, fever, fatigue x 3 days. Unrelieved with OTC meds. No appt avail at PCP. Appt scheduled at [facility]  Reason for Disposition  [1] Continuous (nonstop) coughing interferes with work or school AND [2] no improvement using cough treatment per Care Advice  Answer Assessment - Initial Assessment Questions 1. ONSET: "When did the cough begin?"      2 days ago  2. SEVERITY: "How bad is the cough today?"      Getting worse  3. SPUTUM: "Describe the color of your sputum" (none, dry cough; clear, white, yellow, green)     Yellow-ish green  4. HEMOPTYSIS: "Are you coughing up any blood?" If so ask: "How much?" (flecks, streaks, tablespoons, etc.)     No  5. DIFFICULTY BREATHING: "Are you having difficulty breathing?" If Yes, ask: "How bad is it?" (e.g., mild, moderate, severe)    - MILD: No SOB at rest, mild SOB with walking, speaks normally in sentences, can lie down, no retractions, pulse < 100.    - MODERATE: SOB at rest, SOB with minimal exertion and prefers to sit, cannot lie down flat, speaks in phrases, mild retractions, audible wheezing, pulse 100-120.    - SEVERE: Very SOB at rest, speaks in single words, struggling to breathe, sitting hunched forward, retractions, pulse > 120       No shortness  6. FEVER: "Do you have a fever?" If Yes, ask: "What is your temperature, how was it measured, and when did it start?"     Unsure if he had actually fever, but has been getting really hot and sweaty, then really cold  7. CARDIAC HISTORY: "Do you have any history of heart disease?" (e.g., heart attack, congestive heart failure)      HLD and HTN  8. LUNG HISTORY: "Do you have any history of lung disease?"  (e.g., pulmonary embolus, asthma, emphysema)     No  9. PE RISK FACTORS: "Do you have a history of blood clots?" (or: recent major surgery, recent prolonged travel, bedridden)     No  10. OTHER SYMPTOMS: "Do you have any other symptoms?" (e.g., runny nose, wheezing, chest pain)       Runny eyes, fatigue, runny nose, headache, no appetite  11. PREGNANCY: "Is there any chance you are pregnant?" "When was your last menstrual period?"       N/a  12. TRAVEL: "Have you traveled out of the country in the last month?" (e.g., travel history, exposures)       No  Protocols used: Cough - Acute Productive-A-AH

## 2023-11-04 NOTE — Progress Notes (Signed)
 Established patient visit   Patient: Cory Hoffman   DOB: 1976/02/06   48 y.o. Male  MRN: [id number] Visit Date: 11/04/2023  Today's healthcare provider: Alfredia Ferguson, PA-C   Chief Complaint  Patient presents with   Nasal Congestion    Complains of nasal congestion, this started Monday   Cough    Complains of persistent cough   Subjective     Pt reports nasal congestion, sinus pressure, dizziness, cough, ears are full, fatigued x 4-5 days. He reports taking alleve-D, nyquil otc , using nose sprays, antihistamines with no relief. COVID negative this AM.   Medications: Outpatient Medications Prior to Visit  Medication Sig   atorvastatin (LIPITOR) 20 MG tablet Take 1 tablet (20 mg total) by mouth daily.   EPINEPHrine 0.3 mg/0.3 mL IJ SOAJ injection Inject 0.3 mg into the muscle as needed for anaphylaxis. (Patient not taking: Reported on 08/01/2023)   escitalopram (LEXAPRO) 20 MG tablet Take 1 tablet (20 mg total) by mouth daily.   famotidine (PEPCID) 20 MG tablet Take 1 tab twice daily day before and day of [facility] appt.   fluticasone (FLONASE) 50 MCG/ACT nasal spray Administer 1 spray in each nostril 2 times daily.   levocetirizine (XYZAL) 5 MG tablet Take 1 tablet by mouth daily.   loratadine (CLARITIN) 10 MG tablet Take 1 tablet by mouth daily. One or the other of the allergy meds   montelukast (SINGULAIR) 10 MG tablet Take 1 tablet (10 mg total) by mouth at bedtime. (Patient not taking: Reported on 07/18/2023)   olmesartan (BENICAR) 20 MG tablet Take 1 tablet (20 mg total) by mouth daily.   omeprazole (PRILOSEC) 20 MG capsule TAKE 1 CAPSULE(20 MG) BY MOUTH DAILY   zolpidem (AMBIEN) 10 MG tablet Take 1 tablet (10 mg total) by mouth at bedtime as needed for sleep.   No facility-administered medications prior to visit.    Review of Systems  Constitutional:  Positive for fatigue. Negative for fever.  HENT:  Positive for ear pain, nosebleeds, postnasal drip and sinus  pressure.   Respiratory:  Positive for cough. Negative for shortness of breath.   Cardiovascular:  Negative for chest pain, palpitations and leg swelling.  Neurological:  Positive for headaches. Negative for dizziness.        Objective    BP (!) 140/95 (BP Location: Right Arm, Patient Position: Sitting)   Pulse 71   Temp 98.7 F (37.1 C) (Oral)   Resp 16   Ht 5\' 9"  (1.753 m)   Wt 202 lb (91.6 kg)   SpO2 100%   BMI 29.83 kg/m    Physical Exam Constitutional:      General: He is awake.     Appearance: He is well-developed.  HENT:     Head: Normocephalic.     Right Ear: Tympanic membrane normal.     Left Ear: Tympanic membrane normal.     Ears:     Comments: B/l serous effusion, mild, behind TM    Mouth/Throat:     Pharynx: Posterior oropharyngeal erythema present. No oropharyngeal exudate.  Eyes:     Conjunctiva/sclera: Conjunctivae normal.  Cardiovascular:     Rate and Rhythm: Normal rate and regular rhythm.     Heart sounds: Normal heart sounds.  Pulmonary:     Effort: Pulmonary effort is normal.     Breath sounds: Normal breath sounds. No wheezing, rhonchi or rales.  Skin:    General: Skin is warm.  Neurological:  Mental Status: He is alert and oriented to person, place, and time.  Psychiatric:        Attention and Perception: Attention normal.        Mood and Affect: Mood normal.        Speech: Speech normal.        Behavior: Behavior is cooperative.      No results found for any visits on 11/04/23.  Assessment & Plan    Upper respiratory tract infection, unspecified type -     predniSONE; Take 1 tablet (20 mg total) by mouth daily with breakfast for 5 days.  Dispense: 5 tablet; Refill: 0  Acute cough -     POCT Influenza A/B  Flu negative. Recommend rest, hydration, cont nasal sprays, antihistamines, rx prednisone 20 x 5 day  Return if symptoms worsen or fail to improve.      Alfredia Ferguson, PA-C  [facility] at  [facility] [phone] (phone) [phone] (fax)  [facility]

## 2023-11-04 NOTE — Telephone Encounter (Signed)
 Noted. Dm/cma

## 2023-11-09 ENCOUNTER — Encounter: Payer: Self-pay | Admitting: Physician Assistant

## 2023-11-10 ENCOUNTER — Other Ambulatory Visit: Payer: Self-pay | Admitting: Physician Assistant

## 2023-11-10 ENCOUNTER — Ambulatory Visit (INDEPENDENT_AMBULATORY_CARE_PROVIDER_SITE_OTHER): Payer: Self-pay

## 2023-11-10 DIAGNOSIS — J309 Allergic rhinitis, unspecified: Secondary | ICD-10-CM | POA: Diagnosis not present

## 2023-11-10 MED ORDER — AZITHROMYCIN 250 MG PO TABS
ORAL_TABLET | ORAL | 0 refills | Status: AC
Start: 2023-11-10 — End: 2023-11-15

## 2023-11-23 ENCOUNTER — Ambulatory Visit (INDEPENDENT_AMBULATORY_CARE_PROVIDER_SITE_OTHER): Payer: Self-pay

## 2023-11-23 DIAGNOSIS — J309 Allergic rhinitis, unspecified: Secondary | ICD-10-CM

## 2023-12-13 ENCOUNTER — Other Ambulatory Visit: Payer: Self-pay | Admitting: Family Medicine

## 2023-12-13 ENCOUNTER — Other Ambulatory Visit: Payer: Self-pay

## 2023-12-13 DIAGNOSIS — G4700 Insomnia, unspecified: Secondary | ICD-10-CM

## 2023-12-13 MED ORDER — ZOLPIDEM TARTRATE 10 MG PO TABS
10.0000 mg | ORAL_TABLET | Freq: Every evening | ORAL | 0 refills | Status: DC | PRN
Start: 2023-12-13 — End: 2024-01-06

## 2023-12-13 MED ORDER — ZOLPIDEM TARTRATE 10 MG PO TABS: 10.0000 mg | ORAL_TABLET | Freq: Every evening | ORAL | 0 refills | Status: DC | PRN

## 2023-12-15 ENCOUNTER — Ambulatory Visit: Admitting: Family Medicine

## 2023-12-16 ENCOUNTER — Ambulatory Visit (INDEPENDENT_AMBULATORY_CARE_PROVIDER_SITE_OTHER): Payer: Self-pay

## 2023-12-16 DIAGNOSIS — J309 Allergic rhinitis, unspecified: Secondary | ICD-10-CM

## 2023-12-30 ENCOUNTER — Encounter (HOSPITAL_COMMUNITY): Payer: Self-pay

## 2024-01-06 ENCOUNTER — Ambulatory Visit: Admitting: Family Medicine

## 2024-01-06 ENCOUNTER — Encounter: Payer: Self-pay | Admitting: Family Medicine

## 2024-01-06 VITALS — BP 118/80 | HR 67 | Temp 97.2°F | Ht 69.0 in | Wt 203.6 lb

## 2024-01-06 DIAGNOSIS — E782 Mixed hyperlipidemia: Secondary | ICD-10-CM | POA: Diagnosis not present

## 2024-01-06 DIAGNOSIS — I10 Essential (primary) hypertension: Secondary | ICD-10-CM

## 2024-01-06 DIAGNOSIS — K219 Gastro-esophageal reflux disease without esophagitis: Secondary | ICD-10-CM | POA: Diagnosis not present

## 2024-01-06 DIAGNOSIS — Z566 Other physical and mental strain related to work: Secondary | ICD-10-CM

## 2024-01-06 DIAGNOSIS — F419 Anxiety disorder, unspecified: Secondary | ICD-10-CM

## 2024-01-06 DIAGNOSIS — G4700 Insomnia, unspecified: Secondary | ICD-10-CM

## 2024-01-06 DIAGNOSIS — T887XXA Unspecified adverse effect of drug or medicament, initial encounter: Secondary | ICD-10-CM

## 2024-01-06 MED ORDER — ESCITALOPRAM OXALATE 20 MG PO TABS
20.0000 mg | ORAL_TABLET | Freq: Every day | ORAL | 1 refills | Status: DC
Start: 2024-01-06 — End: 2024-01-12

## 2024-01-06 MED ORDER — ATORVASTATIN CALCIUM 20 MG PO TABS: 20.0000 mg | ORAL_TABLET | Freq: Every day | ORAL | 3 refills | Status: DC

## 2024-01-06 MED ORDER — OMEPRAZOLE 20 MG PO CPDR: DELAYED_RELEASE_CAPSULE | ORAL | 1 refills | Status: DC

## 2024-01-06 MED ORDER — ZOLPIDEM TARTRATE 10 MG PO TABS: 10.0000 mg | ORAL_TABLET | Freq: Every evening | ORAL | 0 refills | Status: DC | PRN

## 2024-01-06 MED ORDER — AMLODIPINE BESYLATE 5 MG PO TABS
5.0000 mg | ORAL_TABLET | Freq: Every day | ORAL | 1 refills | Status: DC
Start: 2024-01-06 — End: 2024-04-17

## 2024-01-06 NOTE — Progress Notes (Signed)
 Established Patient Office Visit   Subjective:  Patient ID: Cory Hoffman, male    DOB: [birth year]  Age: 48 y.o. MRN: [id number]  Chief Complaint  Patient presents with   Medical Management of Chronic Issues    Follow up. Pt is fasting. Blood pressure is causing a dry cough x 6 months.     HPI Encounter Diagnoses  Name Primary?   Essential hypertension Yes   Gastroesophageal reflux disease without esophagitis    Mixed hyperlipidemia    Stress at work    Anxiety    Insomnia, unspecified type    Medication side effect   For follow-up of above.  She reports an ongoing dry cough that seemed to start shortly after beginning therapy with olmesartan .  There has been no wheezing cough deep breathing.  No fevers or chills.  Reflux is well-controlled with omeprazole .  He does have postnasal drip at this point allergies.  He is using his Flonase and nasal rinses.  Stress at work has improved.  Lexapro  seems to be helping some.  Continues Ambien  for sleep.  Continues atorvastatin  for cholesterol.  Reminds me that he quit tobacco a year and a half ago.   Review of Systems  Constitutional: Negative.   HENT: Negative.    Eyes:  Negative for blurred vision, discharge and redness.  Respiratory:  Positive for cough. Negative for hemoptysis, sputum production, shortness of breath and wheezing.   Cardiovascular: Negative.   Gastrointestinal:  Negative for abdominal pain.  Genitourinary: Negative.   Musculoskeletal: Negative.  Negative for myalgias.  Skin:  Negative for rash.  Neurological:  Negative for tingling, loss of consciousness and weakness.  Endo/Heme/Allergies:  Negative for polydipsia.     Current Outpatient Medications:    amLODipine (NORVASC) 5 MG tablet, Take 1 tablet (5 mg total) by mouth daily., Disp: 90 tablet, Rfl: 1   EPINEPHrine  0.3 mg/0.3 mL IJ SOAJ injection, Inject 0.3 mg into the muscle as needed for anaphylaxis., Disp: 1 each, Rfl: 0   famotidine  (PEPCID ) 20 MG  tablet, Take 1 tab twice daily day before and day of [facility] appt., Disp: 4 tablet, Rfl: 0   fluticasone (FLONASE) 50 MCG/ACT nasal spray, Administer 1 spray in each nostril 2 times daily., Disp: , Rfl:    levocetirizine (XYZAL) 5 MG tablet, Take 1 tablet by mouth daily., Disp: , Rfl:    loratadine (CLARITIN) 10 MG tablet, Take 1 tablet by mouth daily. One or the other of the allergy  meds, Disp: , Rfl:    montelukast  (SINGULAIR ) 10 MG tablet, Take 1 tablet (10 mg total) by mouth at bedtime., Disp: 90 tablet, Rfl: 1   atorvastatin  (LIPITOR) 20 MG tablet, Take 1 tablet (20 mg total) by mouth daily., Disp: 90 tablet, Rfl: 3   escitalopram  (LEXAPRO ) 20 MG tablet, Take 1 tablet (20 mg total) by mouth daily., Disp: 90 tablet, Rfl: 1   omeprazole  (PRILOSEC) 20 MG capsule, TAKE 1 CAPSULE(20 MG) BY MOUTH DAILY, Disp: 90 capsule, Rfl: 1   zolpidem  (AMBIEN ) 10 MG tablet, Take 1 tablet (10 mg total) by mouth at bedtime as needed for sleep., Disp: 90 tablet, Rfl: 0   Objective:     BP 118/80 (Cuff Size: Normal)   Pulse 67   Temp (!) 97.2 F (36.2 C) (Temporal)   Ht 5\' 9"  (1.753 m)   Wt 203 lb 9.6 oz (92.4 kg)   SpO2 99%   BMI 30.07 kg/m    Physical Exam Constitutional:  General: He is not in acute distress.    Appearance: Normal appearance. He is not ill-appearing, toxic-appearing or diaphoretic.  HENT:     Head: Normocephalic and atraumatic.     Right Ear: External ear normal.     Left Ear: External ear normal.  Eyes:     General: No scleral icterus.       Right eye: No discharge.        Left eye: No discharge.     Extraocular Movements: Extraocular movements intact.     Conjunctiva/sclera: Conjunctivae normal.  Cardiovascular:     Rate and Rhythm: Normal rate and regular rhythm.  Pulmonary:     Effort: Pulmonary effort is normal. No respiratory distress.     Breath sounds: No wheezing, rhonchi or rales.  Skin:    General: Skin is warm and dry.  Neurological:     Mental Status: He  is alert and oriented to person, place, and time.  Psychiatric:        Mood and Affect: Mood normal.        Behavior: Behavior normal.      No results found for any visits on 01/06/24.    The 10-year ASCVD risk score (Arnett DK, et al., 2019) is: 3.1%    Assessment & Plan:   Essential hypertension -     amLODIPine Besylate; Take 1 tablet (5 mg total) by mouth daily.  Dispense: 90 tablet; Refill: 1  Gastroesophageal reflux disease without esophagitis -     Omeprazole ; TAKE 1 CAPSULE(20 MG) BY MOUTH DAILY  Dispense: 90 capsule; Refill: 1  Mixed hyperlipidemia -     Atorvastatin  Calcium ; Take 1 tablet (20 mg total) by mouth daily.  Dispense: 90 tablet; Refill: 3  Stress at work -     Escitalopram  Oxalate; Take 1 tablet (20 mg total) by mouth daily.  Dispense: 90 tablet; Refill: 1  Anxiety -     Escitalopram  Oxalate; Take 1 tablet (20 mg total) by mouth daily.  Dispense: 90 tablet; Refill: 1  Insomnia, unspecified type -     Zolpidem  Tartrate; Take 1 tablet (10 mg total) by mouth at bedtime as needed for sleep.  Dispense: 90 tablet; Refill: 0  Medication side effect    Return in about 3 months (around 04/07/2024) for annual physical, chronic disease follow-up.  Continue all medications as above.  Warned that amlodipine works overtime.  Look for swelling in lower extremities that is more common in the higher doses.  Information about amlodipine was given.  He will discontinue olmesartan .  Cory Frederic, MD

## 2024-01-12 ENCOUNTER — Other Ambulatory Visit: Payer: Self-pay | Admitting: Family Medicine

## 2024-01-13 ENCOUNTER — Ambulatory Visit (INDEPENDENT_AMBULATORY_CARE_PROVIDER_SITE_OTHER): Admitting: *Deleted

## 2024-01-13 DIAGNOSIS — J309 Allergic rhinitis, unspecified: Secondary | ICD-10-CM

## 2024-02-23 ENCOUNTER — Ambulatory Visit (INDEPENDENT_AMBULATORY_CARE_PROVIDER_SITE_OTHER)

## 2024-02-23 DIAGNOSIS — J309 Allergic rhinitis, unspecified: Secondary | ICD-10-CM | POA: Insufficient documentation

## 2024-03-29 NOTE — Progress Notes (Signed)
 VIALS MADE 03-29-24

## 2024-04-02 ENCOUNTER — Encounter: Payer: Self-pay | Admitting: Family Medicine

## 2024-04-02 DIAGNOSIS — J3081 Allergic rhinitis due to animal (cat) (dog) hair and dander: Secondary | ICD-10-CM | POA: Diagnosis not present

## 2024-04-03 ENCOUNTER — Encounter: Payer: Self-pay | Admitting: Family Medicine

## 2024-04-03 ENCOUNTER — Ambulatory Visit (INDEPENDENT_AMBULATORY_CARE_PROVIDER_SITE_OTHER): Admitting: Family Medicine

## 2024-04-03 VITALS — BP 120/80 | HR 57 | Temp 97.3°F | Ht 69.0 in | Wt 203.0 lb

## 2024-04-03 DIAGNOSIS — Z0000 Encounter for general adult medical examination without abnormal findings: Secondary | ICD-10-CM | POA: Diagnosis not present

## 2024-04-03 DIAGNOSIS — N5089 Other specified disorders of the male genital organs: Secondary | ICD-10-CM

## 2024-04-03 DIAGNOSIS — Z23 Encounter for immunization: Secondary | ICD-10-CM | POA: Diagnosis not present

## 2024-04-03 DIAGNOSIS — G4700 Insomnia, unspecified: Secondary | ICD-10-CM | POA: Diagnosis not present

## 2024-04-03 DIAGNOSIS — I10 Essential (primary) hypertension: Secondary | ICD-10-CM | POA: Diagnosis not present

## 2024-04-03 DIAGNOSIS — Z125 Encounter for screening for malignant neoplasm of prostate: Secondary | ICD-10-CM | POA: Diagnosis not present

## 2024-04-03 DIAGNOSIS — Z131 Encounter for screening for diabetes mellitus: Secondary | ICD-10-CM

## 2024-04-03 DIAGNOSIS — R7989 Other specified abnormal findings of blood chemistry: Secondary | ICD-10-CM

## 2024-04-03 DIAGNOSIS — J3089 Other allergic rhinitis: Secondary | ICD-10-CM | POA: Diagnosis not present

## 2024-04-03 DIAGNOSIS — E782 Mixed hyperlipidemia: Secondary | ICD-10-CM

## 2024-04-03 DIAGNOSIS — R6882 Decreased libido: Secondary | ICD-10-CM

## 2024-04-03 MED ORDER — ZOLPIDEM TARTRATE 10 MG PO TABS: 10.0000 mg | ORAL_TABLET | Freq: Every evening | ORAL | 0 refills | Status: DC | PRN

## 2024-04-03 NOTE — Progress Notes (Addendum)
 Established Patient Office Visit   Subjective:  Patient ID: Cory Hoffman, male    DOB: 1976-04-20  Age: 48 y.o. MRN: [id number]  Chief Complaint  Patient presents with   Annual Exam    CPE. Pt is fasting. Pt is requesting all labs to be sen to [facility].     HPI Encounter Diagnoses  Name Primary?   Healthcare maintenance Yes   Insomnia, unspecified type    Screening for prostate cancer    Essential hypertension    Screening for diabetes mellitus    Immunization due    Mixed hyperlipidemia    Testicular mass    Libido, decreased    Low testosterone     Here for physical and follow-up of above.  He is exercising by walking and jogging.  He does have regular dental care.  He continues current medications.  He has his business up for sale.  Has noted somewhat of a decrease in his libido.  Continues Lexapro  for stress and it is helpful.  Continues Ambien  for sleep.  He is having no issues taking either 1 of these medications.   Review of Systems  Constitutional: Negative.   HENT: Negative.    Eyes:  Negative for blurred vision, discharge and redness.  Respiratory: Negative.    Cardiovascular: Negative.   Gastrointestinal:  Negative for abdominal pain.  Genitourinary: Negative.   Musculoskeletal: Negative.  Negative for myalgias.  Skin:  Negative for rash.  Neurological:  Negative for tingling, loss of consciousness and weakness.  Endo/Heme/Allergies:  Negative for polydipsia.      04/03/2024   10:16 AM 03/31/2023    8:19 AM 10/29/2022    8:45 AM  Depression screen PHQ 2/9  Decreased Interest 0 0 0  Down, Depressed, Hopeless 0 0 0  PHQ - 2 Score 0 0 0  Altered sleeping 2 0 2  Tired, decreased energy 2 0 2  Change in appetite 0 0 0  Feeling bad or failure about yourself  0 0 0  Trouble concentrating 0 0 1  Moving slowly or fidgety/restless 0 0 1  Suicidal thoughts 0 0 0  PHQ-9 Score 4 0 6  Difficult doing work/chores Not difficult at all Not difficult at all Somewhat  difficult      Current Outpatient Medications:    amLODipine  (NORVASC ) 5 MG tablet, Take 1 tablet (5 mg total) by mouth daily., Disp: 90 tablet, Rfl: 1   atorvastatin  (LIPITOR) 40 MG tablet, Take 1 tablet (40 mg total) by mouth daily., Disp: 90 tablet, Rfl: 3   EPINEPHrine  0.3 mg/0.3 mL IJ SOAJ injection, Inject 0.3 mg into the muscle as needed for anaphylaxis., Disp: 1 each, Rfl: 0   famotidine  (PEPCID ) 20 MG tablet, Take 1 tab twice daily day before and day of [facility] appt., Disp: 4 tablet, Rfl: 0   fluticasone (FLONASE) 50 MCG/ACT nasal spray, Administer 1 spray in each nostril 2 times daily., Disp: , Rfl:    levocetirizine (XYZAL) 5 MG tablet, Take 1 tablet by mouth daily., Disp: , Rfl:    loratadine (CLARITIN) 10 MG tablet, Take 1 tablet by mouth daily. One or the other of the allergy  meds, Disp: , Rfl:    omeprazole  (PRILOSEC) 20 MG capsule, TAKE 1 CAPSULE(20 MG) BY MOUTH DAILY, Disp: 90 capsule, Rfl: 1   montelukast  (SINGULAIR ) 10 MG tablet, Take 1 tablet (10 mg total) by mouth at bedtime. (Patient not taking: Reported on 04/03/2024), Disp: 90 tablet, Rfl: 1   zolpidem  (AMBIEN ) 10  MG tablet, Take 1 tablet (10 mg total) by mouth at bedtime as needed for sleep., Disp: 90 tablet, Rfl: 0   Objective:     BP 120/80   Pulse (!) 57   Temp (!) 97.3 F (36.3 C)   Ht 5' 9 (1.753 m)   Wt 203 lb (92.1 kg)   BMI 29.98 kg/m  BP Readings from Last 3 Encounters:  04/03/24 120/80  01/06/24 118/80  11/04/23 (!) 140/95   Wt Readings from Last 3 Encounters:  04/03/24 203 lb (92.1 kg)  01/06/24 203 lb 9.6 oz (92.4 kg)  11/04/23 202 lb (91.6 kg)      Physical Exam Constitutional:      General: He is not in acute distress.    Appearance: Normal appearance. He is not ill-appearing, toxic-appearing or diaphoretic.  HENT:     Head: Normocephalic and atraumatic.     Right Ear: Tympanic membrane, ear canal and external ear normal.     Left Ear: Tympanic membrane, ear canal and external ear  normal.     Mouth/Throat:     Mouth: Mucous membranes are moist.     Pharynx: Oropharynx is clear. No oropharyngeal exudate or posterior oropharyngeal erythema.  Eyes:     General: No scleral icterus.       Right eye: No discharge.        Left eye: No discharge.     Extraocular Movements: Extraocular movements intact.     Conjunctiva/sclera: Conjunctivae normal.     Pupils: Pupils are equal, round, and reactive to light.  Cardiovascular:     Rate and Rhythm: Normal rate and regular rhythm.  Pulmonary:     Effort: Pulmonary effort is normal. No respiratory distress.     Breath sounds: Normal breath sounds. No wheezing or rales.  Abdominal:     General: Bowel sounds are normal.     Tenderness: There is no abdominal tenderness. There is no right CVA tenderness, left CVA tenderness, guarding or rebound.     Hernia: There is no hernia in the left inguinal area or right inguinal area.  Genitourinary:    Penis: Circumcised. No hypospadias, erythema, tenderness, discharge, swelling or lesions.      Testes:        Right: Mass (Small mass upper pole?) present. Tenderness or swelling not present. Right testis is descended.        Left: Mass, tenderness or swelling not present. Left testis is descended.     Epididymis:     Right: Not inflamed or enlarged. No mass.     Left: Not inflamed or enlarged. No mass.  Musculoskeletal:     Cervical back: No rigidity or tenderness.  Lymphadenopathy:     Lower Body: No left inguinal adenopathy.  Skin:    General: Skin is warm and dry.  Neurological:     Mental Status: He is alert and oriented to person, place, and time.  Psychiatric:        Mood and Affect: Mood normal.        Behavior: Behavior normal.      Results for orders placed or performed in visit on 04/03/24  CBC with Differential/Platelet  Result Value Ref Range   WBC 8.5 3.4 - 10.8 x10E3/uL   RBC 4.69 4.14 - 5.80 x10E6/uL   Hemoglobin 15.0 13.0 - 17.7 g/dL   Hematocrit 55.8 62.4 -  51.0 %   MCV 94 79 - 97 fL   MCH 32.0 26.6 - 33.0 pg  MCHC 34.0 31.5 - 35.7 g/dL   RDW 87.0 88.3 - 84.5 %   Platelets 301 150 - 450 x10E3/uL   Neutrophils 62 Not Estab. %   Lymphs 30 Not Estab. %   Monocytes 5 Not Estab. %   Eos 1 Not Estab. %   Basos 1 Not Estab. %   Neutrophils Absolute 5.3 1.4 - 7.0 x10E3/uL   Lymphocytes Absolute 2.6 0.7 - 3.1 x10E3/uL   Monocytes Absolute 0.4 0.1 - 0.9 x10E3/uL   EOS (ABSOLUTE) 0.1 0.0 - 0.4 x10E3/uL   Basophils Absolute 0.0 0.0 - 0.2 x10E3/uL   Immature Granulocytes 0 Not Estab. %   Immature Grans (Abs) 0.0 0.0 - 0.1 x10E3/uL  Comprehensive metabolic panel with GFR  Result Value Ref Range   Glucose 98 70 - 99 mg/dL   BUN 7 6 - 24 mg/dL   Creatinine, Ser 8.99 0.76 - 1.27 mg/dL   eGFR 95 >40 fO/fpw/8.26   BUN/Creatinine Ratio 7 (L) 9 - 20   Sodium 139 134 - 144 mmol/L   Potassium 3.6 3.5 - 5.2 mmol/L   Chloride 100 96 - 106 mmol/L   CO2 23 20 - 29 mmol/L   Calcium  9.5 8.7 - 10.2 mg/dL   Total Protein 6.7 6.0 - 8.5 g/dL   Albumin 4.4 4.1 - 5.1 g/dL   Globulin, Total 2.3 1.5 - 4.5 g/dL   Bilirubin Total 0.4 0.0 - 1.2 mg/dL   Alkaline Phosphatase 73 44 - 121 IU/L   AST 23 0 - 40 IU/L   ALT 19 0 - 44 IU/L  Lipid panel  Result Value Ref Range   Cholesterol, Total 253 (H) 100 - 199 mg/dL   Triglycerides 711 (H) 0 - 149 mg/dL   HDL 48 >60 mg/dL   VLDL Cholesterol Cal 53 (H) 5 - 40 mg/dL   LDL Chol Calc (NIH) 847 (H) 0 - 99 mg/dL   Chol/HDL Ratio 5.3 (H) 0.0 - 5.0 ratio  PSA  Result Value Ref Range   Prostate Specific Ag, Serum 1.5 0.0 - 4.0 ng/mL  Urinalysis, Routine w reflex microscopic  Result Value Ref Range   Specific Gravity, UA      <=1.005 (A) 1.005 - 1.030   pH, UA 7.0 5.0 - 7.5   Color, UA Yellow Yellow   Appearance Ur Clear Clear   Leukocytes,UA Negative Negative   Protein,UA Negative Negative/Trace   Glucose, UA Negative Negative   Ketones, UA Negative Negative   RBC, UA Negative Negative   Bilirubin, UA Negative  Negative   Urobilinogen, Ur 0.2 0.2 - 1.0 mg/dL   Nitrite, UA Negative Negative   Microscopic Examination Comment   Hemoglobin A1c  Result Value Ref Range   Hgb A1c MFr Bld 5.4 4.8 - 5.6 %   Est. average glucose Bld gHb Est-mCnc 108 mg/dL  Testosterone   Result Value Ref Range   Testosterone  252 (L) 264 - 916 ng/dL      The 89-bzjm ASCVD risk score (Arnett DK, et al., 2019) is: 3.3%    Assessment & Plan:   Healthcare maintenance  Insomnia, unspecified type -     Zolpidem  Tartrate; Take 1 tablet (10 mg total) by mouth at bedtime as needed for sleep.  Dispense: 90 tablet; Refill: 0  Screening for prostate cancer -     PSA  Essential hypertension -     CBC with Differential/Platelet -     Comprehensive metabolic panel with GFR -     Urinalysis, Routine w  reflex microscopic  Screening for diabetes mellitus -     Comprehensive metabolic panel with GFR -     Hemoglobin A1c  Immunization due -     Tdap vaccine greater than or equal to 7yo IM  Mixed hyperlipidemia -     Comprehensive metabolic panel with GFR -     Lipid panel -     Atorvastatin  Calcium ; Take 1 tablet (40 mg total) by mouth daily.  Dispense: 90 tablet; Refill: 3  Testicular mass -     US  SCROTUM; Future  Libido, decreased -     Testosterone   Low testosterone  -     Testosterone  Total,Free,Bio, Males; Future    Return in about 1 year (around 04/03/2025), or if symptoms worsen or fail to improve.  Information given on health maintenance and disease prevention.  Encouraged regular exercise with 7000 steps daily.  Information was given on exercising to lose weight.  Continue current medications.  Elsie Sim Lent, MD  8/18 addendum: Patient reports that he has been compliant with his atorvastatin  at 20 mg daily.  Have increased it to 40 mg secondary to persisting moderate elevation of LDL cholesterol.

## 2024-04-04 LAB — HEMOGLOBIN A1C
Est. average glucose Bld gHb Est-mCnc: 108 mg/dL
Hgb A1c MFr Bld: 5.4 % (ref 4.8–5.6)

## 2024-04-04 LAB — COMPREHENSIVE METABOLIC PANEL WITH GFR
ALT: 19 IU/L (ref 0–44)
AST: 23 IU/L (ref 0–40)
Albumin: 4.4 g/dL (ref 4.1–5.1)
Alkaline Phosphatase: 73 IU/L (ref 44–121)
BUN/Creatinine Ratio: 7 — ABNORMAL LOW (ref 9–20)
BUN: 7 mg/dL (ref 6–24)
Bilirubin Total: 0.4 mg/dL (ref 0.0–1.2)
CO2: 23 mmol/L (ref 20–29)
Calcium: 9.5 mg/dL (ref 8.7–10.2)
Chloride: 100 mmol/L (ref 96–106)
Creatinine, Ser: 1 mg/dL (ref 0.76–1.27)
Globulin, Total: 2.3 g/dL (ref 1.5–4.5)
Glucose: 98 mg/dL (ref 70–99)
Potassium: 3.6 mmol/L (ref 3.5–5.2)
Sodium: 139 mmol/L (ref 134–144)
Total Protein: 6.7 g/dL (ref 6.0–8.5)
eGFR: 95 mL/min/1.73 (ref >59)

## 2024-04-04 LAB — CBC WITH DIFFERENTIAL/PLATELET
Basophils Absolute: 0 x10E3/uL (ref 0.0–0.2)
Basos: 1 %
EOS (ABSOLUTE): 0.1 x10E3/uL (ref 0.0–0.4)
Eos: 1 %
Hematocrit: 44.1 % (ref 37.5–51.0)
Hemoglobin: 15 g/dL (ref 13.0–17.7)
Immature Grans (Abs): 0 x10E3/uL (ref 0.0–0.1)
Immature Granulocytes: 0 %
Lymphocytes Absolute: 2.6 x10E3/uL (ref 0.7–3.1)
Lymphs: 30 %
MCH: 32 pg (ref 26.6–33.0)
MCHC: 34 g/dL (ref 31.5–35.7)
MCV: 94 fL (ref 79–97)
Monocytes Absolute: 0.4 x10E3/uL (ref 0.1–0.9)
Monocytes: 5 %
Neutrophils Absolute: 5.3 x10E3/uL (ref 1.4–7.0)
Neutrophils: 62 %
Platelets: 301 x10E3/uL (ref 150–450)
RBC: 4.69 x10E6/uL (ref 4.14–5.80)
RDW: 12.9 % (ref 11.6–15.4)
WBC: 8.5 x10E3/uL (ref 3.4–10.8)

## 2024-04-04 LAB — PSA: Prostate Specific Ag, Serum: 1.5 ng/mL (ref 0.0–4.0)

## 2024-04-04 LAB — URINALYSIS, ROUTINE W REFLEX MICROSCOPIC
Bilirubin, UA: NEGATIVE
Glucose, UA: NEGATIVE
Ketones, UA: NEGATIVE
Leukocytes,UA: NEGATIVE
Nitrite, UA: NEGATIVE
Protein,UA: NEGATIVE
RBC, UA: NEGATIVE
Specific Gravity, UA: <=1.005 — AB (ref 1.005–1.030)
Urobilinogen, Ur: 0.2 mg/dL (ref 0.2–1.0)
pH, UA: 7 (ref 5.0–7.5)

## 2024-04-04 LAB — LIPID PANEL
Chol/HDL Ratio: 5.3 ratio — ABNORMAL HIGH (ref 0.0–5.0)
Cholesterol, Total: 253 mg/dL — ABNORMAL HIGH (ref 100–199)
HDL: 48 mg/dL (ref >39)
LDL Chol Calc (NIH): 152 mg/dL — ABNORMAL HIGH (ref 0–99)
Triglycerides: 288 mg/dL — ABNORMAL HIGH (ref 0–149)
VLDL Cholesterol Cal: 53 mg/dL — ABNORMAL HIGH (ref 5–40)

## 2024-04-04 LAB — TESTOSTERONE: Testosterone: 252 ng/dL — ABNORMAL LOW (ref 264–916)

## 2024-04-05 ENCOUNTER — Ambulatory Visit: Payer: Self-pay | Admitting: Family Medicine

## 2024-04-05 ENCOUNTER — Other Ambulatory Visit

## 2024-04-05 NOTE — Addendum Note (Signed)
 Addended by: BERNETA FALLOW A on: 04/05/2024 10:00 AM   Modules accepted: Orders

## 2024-04-09 ENCOUNTER — Encounter: Payer: Self-pay | Admitting: Family Medicine

## 2024-04-09 MED ORDER — ATORVASTATIN CALCIUM 40 MG PO TABS: 40.0000 mg | ORAL_TABLET | Freq: Every day | ORAL | 3 refills | Status: AC

## 2024-04-09 NOTE — Addendum Note (Signed)
 Addended by: BERNETA ELSIE LABOR on: 04/09/2024 09:11 AM   Modules accepted: Orders

## 2024-04-10 ENCOUNTER — Inpatient Hospital Stay
Admission: RE | Admit: 2024-04-10 | Discharge: 2024-04-10 | Discharge status: Home / Self Care | Source: Ambulatory Visit | Attending: Family Medicine | Admitting: Family Medicine

## 2024-04-10 ENCOUNTER — Encounter: Admitting: Family Medicine

## 2024-04-10 ENCOUNTER — Other Ambulatory Visit (INDEPENDENT_AMBULATORY_CARE_PROVIDER_SITE_OTHER)

## 2024-04-10 DIAGNOSIS — N5089 Other specified disorders of the male genital organs: Secondary | ICD-10-CM

## 2024-04-10 DIAGNOSIS — R7989 Other specified abnormal findings of blood chemistry: Secondary | ICD-10-CM

## 2024-04-11 LAB — TESTOSTERONE TOTAL,FREE,BIO, MALES
Albumin: 4.5 g/dL (ref 3.6–5.1)
Sex Hormone Binding: 28 nmol/L (ref 10–50)
Testosterone, Bioavailable: 78.1 ng/dL — ABNORMAL LOW (ref 110.0–575.0)
Testosterone, Free: 38 pg/mL — ABNORMAL LOW (ref 46.0–224.0)
Testosterone: 266 ng/dL (ref 250–827)

## 2024-04-14 ENCOUNTER — Other Ambulatory Visit: Payer: Self-pay | Admitting: Family Medicine

## 2024-04-14 DIAGNOSIS — I10 Essential (primary) hypertension: Secondary | ICD-10-CM

## 2024-04-14 DIAGNOSIS — Z566 Other physical and mental strain related to work: Secondary | ICD-10-CM

## 2024-04-14 DIAGNOSIS — K219 Gastro-esophageal reflux disease without esophagitis: Secondary | ICD-10-CM

## 2024-04-14 DIAGNOSIS — F419 Anxiety disorder, unspecified: Secondary | ICD-10-CM

## 2024-04-16 ENCOUNTER — Encounter: Payer: Self-pay | Admitting: Family Medicine

## 2024-07-24 ENCOUNTER — Telehealth: Payer: Self-pay

## 2024-07-24 NOTE — Telephone Encounter (Signed)
 As of 07/30/24, allergy  vial colors and concentration will be updated. Pt has vial due to expire soon. Pt's last injection was 02/23/24. Pt will be added to contact list to see if he wishes to continue with Allergy  Injections before more vials are made under new protocol.

## 2024-08-15 ENCOUNTER — Encounter: Payer: Self-pay | Admitting: Family Medicine

## 2024-08-17 MED ORDER — ESCITALOPRAM OXALATE 20 MG PO TABS: 20.0000 mg | ORAL_TABLET | Freq: Every day | ORAL | 0 refills | Status: AC

## 2024-08-17 NOTE — Addendum Note (Signed)
 Addended by: BERNETA FALLOW A on: 08/17/2024 10:50 AM   Modules accepted: Orders

## 2024-08-18 ENCOUNTER — Other Ambulatory Visit: Payer: Self-pay | Admitting: Family Medicine

## 2024-08-18 DIAGNOSIS — Z566 Other physical and mental strain related to work: Secondary | ICD-10-CM

## 2024-08-18 DIAGNOSIS — F419 Anxiety disorder, unspecified: Secondary | ICD-10-CM

## 2024-08-18 DIAGNOSIS — G4700 Insomnia, unspecified: Secondary | ICD-10-CM

## 2024-09-10 ENCOUNTER — Encounter: Payer: Self-pay | Admitting: Family Medicine

## 2024-09-10 DIAGNOSIS — G4700 Insomnia, unspecified: Secondary | ICD-10-CM

## 2024-09-10 MED ORDER — ZOLPIDEM TARTRATE 10 MG PO TABS: 10.0000 mg | ORAL_TABLET | Freq: Every evening | ORAL | 0 refills | Status: AC | PRN
